# Patient Record
Sex: Male | Born: 1980 | Race: Black or African American | Hispanic: No | Marital: Single | Smoking: Former smoker
Health system: Southern US, Community
[De-identification: ages and names within clinical notes are randomized; demographics above are authoritative.]

## PROBLEM LIST (undated history)

## (undated) DIAGNOSIS — E119 Type 2 diabetes mellitus without complications: Secondary | ICD-10-CM

## (undated) DIAGNOSIS — E785 Hyperlipidemia, unspecified: Secondary | ICD-10-CM

## (undated) DIAGNOSIS — I10 Essential (primary) hypertension: Secondary | ICD-10-CM

## (undated) HISTORY — DX: Type 2 diabetes mellitus without complications: E11.9

## (undated) HISTORY — DX: Essential (primary) hypertension: I10

## (undated) HISTORY — DX: Hyperlipidemia, unspecified: E78.5

---

## 2012-12-15 ENCOUNTER — Emergency Department (HOSPITAL_COMMUNITY): Payer: Medicaid Other

## 2012-12-15 ENCOUNTER — Inpatient Hospital Stay (HOSPITAL_COMMUNITY)
Admission: EM | Admit: 2012-12-15 | Discharge: 2012-12-20 | DRG: 637 | Disposition: A | Discharge status: Home / Self Care | Payer: Medicaid Other | Attending: Internal Medicine | Admitting: Internal Medicine

## 2012-12-15 ENCOUNTER — Encounter (HOSPITAL_COMMUNITY): Payer: Self-pay | Admitting: Emergency Medicine

## 2012-12-15 DIAGNOSIS — E876 Hypokalemia: Secondary | ICD-10-CM | POA: Diagnosis present

## 2012-12-15 DIAGNOSIS — E1110 Type 2 diabetes mellitus with ketoacidosis without coma: Secondary | ICD-10-CM | POA: Diagnosis present

## 2012-12-15 DIAGNOSIS — E119 Type 2 diabetes mellitus without complications: Secondary | ICD-10-CM

## 2012-12-15 DIAGNOSIS — Z794 Long term (current) use of insulin: Secondary | ICD-10-CM

## 2012-12-15 DIAGNOSIS — G9341 Metabolic encephalopathy: Secondary | ICD-10-CM | POA: Diagnosis present

## 2012-12-15 DIAGNOSIS — F17200 Nicotine dependence, unspecified, uncomplicated: Secondary | ICD-10-CM | POA: Diagnosis present

## 2012-12-15 DIAGNOSIS — R112 Nausea with vomiting, unspecified: Secondary | ICD-10-CM | POA: Diagnosis present

## 2012-12-15 DIAGNOSIS — R631 Polydipsia: Secondary | ICD-10-CM | POA: Diagnosis present

## 2012-12-15 DIAGNOSIS — G9340 Encephalopathy, unspecified: Secondary | ICD-10-CM

## 2012-12-15 DIAGNOSIS — E870 Hyperosmolality and hypernatremia: Secondary | ICD-10-CM | POA: Diagnosis present

## 2012-12-15 DIAGNOSIS — I10 Essential (primary) hypertension: Secondary | ICD-10-CM | POA: Diagnosis present

## 2012-12-15 DIAGNOSIS — R5383 Other fatigue: Secondary | ICD-10-CM | POA: Diagnosis present

## 2012-12-15 DIAGNOSIS — R000 Tachycardia, unspecified: Secondary | ICD-10-CM | POA: Diagnosis present

## 2012-12-15 DIAGNOSIS — Z6826 Body mass index (BMI) 26.0-26.9, adult: Secondary | ICD-10-CM

## 2012-12-15 DIAGNOSIS — R5381 Other malaise: Secondary | ICD-10-CM | POA: Diagnosis present

## 2012-12-15 DIAGNOSIS — N179 Acute kidney failure, unspecified: Secondary | ICD-10-CM

## 2012-12-15 DIAGNOSIS — E1010 Type 1 diabetes mellitus with ketoacidosis without coma: Principal | ICD-10-CM | POA: Diagnosis present

## 2012-12-15 DIAGNOSIS — D72829 Elevated white blood cell count, unspecified: Secondary | ICD-10-CM | POA: Diagnosis present

## 2012-12-15 DIAGNOSIS — E43 Unspecified severe protein-calorie malnutrition: Secondary | ICD-10-CM | POA: Diagnosis present

## 2012-12-15 LAB — MAGNESIUM: Magnesium: 4.5 mg/dL — ABNORMAL HIGH (ref 1.5–2.5)

## 2012-12-15 LAB — COMPREHENSIVE METABOLIC PANEL
ALT: 36 U/L (ref 0–53)
AST: 26 U/L (ref 0–37)
Albumin: 4.1 g/dL (ref 3.5–5.2)
Alkaline Phosphatase: 91 U/L (ref 39–117)
BUN: 53 mg/dL — ABNORMAL HIGH (ref 6–23)
CO2: 8 mEq/L — CL (ref 19–32)
Calcium: 10.6 mg/dL — ABNORMAL HIGH (ref 8.4–10.5)
Chloride: 88 mEq/L — ABNORMAL LOW (ref 96–112)
Creatinine, Ser: 2.84 mg/dL — ABNORMAL HIGH (ref 0.50–1.35)
GFR calc Af Amer: 32 mL/min — ABNORMAL LOW (ref >90)
GFR calc non Af Amer: 28 mL/min — ABNORMAL LOW (ref >90)
Glucose, Bld: 1656 mg/dL (ref 70–99)
Potassium: 3.4 mEq/L — ABNORMAL LOW (ref 3.5–5.1)
Sodium: 136 mEq/L (ref 135–145)
Total Bilirubin: 0.2 mg/dL — ABNORMAL LOW (ref 0.3–1.2)
Total Protein: 8.5 g/dL — ABNORMAL HIGH (ref 6.0–8.3)

## 2012-12-15 LAB — CBC WITH DIFFERENTIAL/PLATELET
Basophils Absolute: 0 10*3/uL (ref 0.0–0.1)
Basophils Relative: 0 % (ref 0–1)
Eosinophils Absolute: 0 10*3/uL (ref 0.0–0.7)
Eosinophils Relative: 0 % (ref 0–5)
HCT: 51.2 % (ref 39.0–52.0)
Hemoglobin: 16.2 g/dL (ref 13.0–17.0)
Lymphocytes Relative: 6 % — ABNORMAL LOW (ref 12–46)
Lymphs Abs: 1.3 10*3/uL (ref 0.7–4.0)
MCH: 31 pg (ref 26.0–34.0)
MCHC: 31.6 g/dL (ref 30.0–36.0)
MCV: 98.1 fL (ref 78.0–100.0)
Monocytes Absolute: 1.8 10*3/uL — ABNORMAL HIGH (ref 0.1–1.0)
Monocytes Relative: 8 % (ref 3–12)
Neutro Abs: 19.3 10*3/uL — ABNORMAL HIGH (ref 1.7–7.7)
Neutrophils Relative %: 86 % — ABNORMAL HIGH (ref 43–77)
Platelets: 347 10*3/uL (ref 150–400)
RBC: 5.22 MIL/uL (ref 4.22–5.81)
RDW: 13.5 % (ref 11.5–15.5)
WBC: 22.4 10*3/uL — ABNORMAL HIGH (ref 4.0–10.5)

## 2012-12-15 LAB — URINALYSIS, ROUTINE W REFLEX MICROSCOPIC
Bilirubin Urine: NEGATIVE
Glucose, UA: >1000 mg/dL — AB
Ketones, ur: 15 mg/dL — AB
Leukocytes, UA: NEGATIVE
Nitrite: NEGATIVE
Protein, ur: 30 mg/dL — AB
Specific Gravity, Urine: 1.038 — ABNORMAL HIGH (ref 1.005–1.030)
Urobilinogen, UA: 0.2 mg/dL (ref 0.0–1.0)
pH: 5 (ref 5.0–8.0)

## 2012-12-15 LAB — BASIC METABOLIC PANEL
BUN: 43 mg/dL — ABNORMAL HIGH (ref 6–23)
Calcium: 8.8 mg/dL (ref 8.4–10.5)
Calcium: 8.9 mg/dL (ref 8.4–10.5)
Calcium: 9 mg/dL (ref 8.4–10.5)
Creatinine, Ser: 2.55 mg/dL — ABNORMAL HIGH (ref 0.50–1.35)
GFR calc Af Amer: 37 mL/min — ABNORMAL LOW (ref >90)
GFR calc Af Amer: 41 mL/min — ABNORMAL LOW (ref >90)
GFR calc non Af Amer: 35 mL/min — ABNORMAL LOW (ref >90)
GFR calc non Af Amer: 36 mL/min — ABNORMAL LOW (ref >90)
Glucose, Bld: 744 mg/dL (ref 70–99)
Potassium: 2.4 mEq/L — CL (ref 3.5–5.1)
Sodium: 158 mEq/L — ABNORMAL HIGH (ref 135–145)
Sodium: 159 mEq/L — ABNORMAL HIGH (ref 135–145)

## 2012-12-15 LAB — GLUCOSE, CAPILLARY
Glucose-Capillary: >600 mg/dL (ref 70–99)
Glucose-Capillary: >600 mg/dL (ref 70–99)
Glucose-Capillary: >600 mg/dL (ref 70–99)
Glucose-Capillary: 564 mg/dL (ref 70–99)
Glucose-Capillary: 587 mg/dL (ref 70–99)
Glucose-Capillary: 537 mg/dL — ABNORMAL HIGH (ref 70–99)

## 2012-12-15 LAB — BLOOD GAS, VENOUS
Acid-base deficit: 17.4 mmol/L — ABNORMAL HIGH (ref 0.0–2.0)
Bicarbonate: 11.8 mEq/L — ABNORMAL LOW (ref 20.0–24.0)
O2 Saturation: 84.7 %
Patient temperature: 98.6
TCO2: 11 mmol/L (ref 0–100)
pCO2, Ven: 38 mmHg — ABNORMAL LOW (ref 45.0–50.0)
pH, Ven: 7.121 — CL (ref 7.250–7.300)
pO2, Ven: 63.6 mmHg — ABNORMAL HIGH (ref 30.0–45.0)

## 2012-12-15 LAB — URINE MICROSCOPIC-ADD ON

## 2012-12-15 LAB — CG4 I-STAT (LACTIC ACID): Lactic Acid, Venous: 3.85 mmol/L — ABNORMAL HIGH (ref 0.5–2.2)

## 2012-12-15 MED ORDER — DEXTROSE 50 % IV SOLN: 25.0000 mL | INTRAVENOUS | Status: DC | PRN

## 2012-12-15 MED ORDER — SODIUM CHLORIDE 0.9 % IV SOLN
1000.0000 mL | Freq: Once | INTRAVENOUS | Status: AC
  Administered 2012-12-15: 1000 mL via INTRAVENOUS

## 2012-12-15 MED ORDER — DEXTROSE-NACL 5-0.45 % IV SOLN: INTRAVENOUS | Status: DC

## 2012-12-15 MED ORDER — SODIUM CHLORIDE 0.9 % IV SOLN
INTRAVENOUS | Status: DC
  Administered 2012-12-15: 200 mL/h via INTRAVENOUS

## 2012-12-15 MED ORDER — SODIUM CHLORIDE 0.9 % IV SOLN
INTRAVENOUS | Status: DC
  Administered 2012-12-15: 17:00:00 via INTRAVENOUS
  Filled 2012-12-15 (×3): qty 1

## 2012-12-15 MED ORDER — POTASSIUM CHLORIDE 10 MEQ/50ML IV SOLN
10.0000 meq | INTRAVENOUS | Status: AC
  Administered 2012-12-15 (×6): 10 meq via INTRAVENOUS
  Filled 2012-12-15 (×2): qty 50
  Filled 2012-12-15: qty 200

## 2012-12-15 MED ORDER — SODIUM CHLORIDE 0.9 % IV SOLN
INTRAVENOUS | Status: DC
  Administered 2012-12-15: 5.4 [IU]/h via INTRAVENOUS
  Filled 2012-12-15: qty 1

## 2012-12-15 MED ORDER — ONDANSETRON HCL 4 MG/2ML IJ SOLN: 4.0000 mg | Freq: Once | INTRAMUSCULAR | Status: DC

## 2012-12-15 MED ORDER — POTASSIUM CHLORIDE 10 MEQ/100ML IV SOLN
10.0000 meq | INTRAVENOUS | Status: AC
  Administered 2012-12-15 (×4): 10 meq via INTRAVENOUS
  Filled 2012-12-15: qty 100

## 2012-12-15 MED ORDER — HEPARIN SODIUM (PORCINE) 5000 UNIT/ML IJ SOLN
5000.0000 [IU] | Freq: Three times a day (TID) | INTRAMUSCULAR | Status: DC
  Administered 2012-12-15 – 2012-12-20 (×13): 5000 [IU] via SUBCUTANEOUS
  Filled 2012-12-15 (×18): qty 1

## 2012-12-15 MED ORDER — SODIUM CHLORIDE 0.9 % IV BOLUS (SEPSIS): 2000.0000 mL | Freq: Once | INTRAVENOUS | Status: DC

## 2012-12-15 MED ORDER — POTASSIUM CHLORIDE 10 MEQ/100ML IV SOLN
10.0000 meq | INTRAVENOUS | Status: DC
  Filled 2012-12-15 (×3): qty 100

## 2012-12-15 MED ORDER — LIDOCAINE HCL 1 % IJ SOLN
INTRAMUSCULAR | Status: AC
  Administered 2012-12-15: 20 mL
  Filled 2012-12-15: qty 20

## 2012-12-15 MED ORDER — SODIUM CHLORIDE 0.9 % IV SOLN
1000.0000 mL | INTRAVENOUS | Status: DC
  Administered 2012-12-15 (×2): 1000 mL via INTRAVENOUS

## 2012-12-15 NOTE — Progress Notes (Signed)
CRITICAL VALUE ALERT  Critical value received:  Glucose 1032  Date of notification:12/15/2012  Time of notification: 1600  Critical value read back: yes  Nurse who received alert:  Volanda Napoleon  MD notified (1st page):   E link  Time of first page:1605 MD notified (2nd page):  Time of second page:  Responding MD:  Dr Sung Amabile  Time MD responded:

## 2012-12-15 NOTE — ED Notes (Signed)
Insulin drip changed to 10.8. Units/hr

## 2012-12-15 NOTE — ED Provider Notes (Signed)
9:53 AM Called to bedside by nursing because pt poorly responsive and tachycardic/hypotensive. Monitor shows sinus tach in 160-170s. Pt lethargic/encephalopathic, but opens eyes to voice and follows basic commands such as squeeze fingers, wiggle toes, etc. Tachypnea. Extremities cool to touch. Dry mucus membranes. Clinically severe DKA/HHS. No identified precipitant. Two week history of polyuria/polydipsia. Increasing fatigue, nausea and vomiting for the past 2-3 days. No energy. Significant unintentional weight loss. Girlfriend reports that patient was approximately 250 pounds about 2 months ago. No significant PMHx. No meds. No routine medical care.   IVx2 established. Needs central line as well. Bedside glucose >600. EKG confirms sinus tach. 2L bolus initially and will undoubtedly need further fluid resuscitation. Insulin gtt. Electrolyte repletion pending labs. Blood gas. UA. Admit.   EKG:  Rhythm: sinus tachycardia Vent. rate 170 BPM PR interval ~120 ms QRS duration 82 ms QT/QTc 304/511 ms ST segments: NS ST changes   CENTRAL LINE Performed by: Raeford Razor Consent: The procedure was performed in an emergent situation. Required items: required blood products, implants, devices, and special equipment available Patient identity confirmed: arm band and provided demographic data Time out: Immediately prior to procedure a "time out" was called to verify the correct patient, procedure, equipment, support staff and site/side marked as required. Indications: vascular access Anesthesia: local infiltration Local anesthetic: lidocaine 1% w/o epinephrine Anesthetic total: 3 ml Patient sedated: no Preparation: skin prepped with 2% chlorhexidine Skin prep agent dried: skin prep agent completely dried prior to procedure Sterile barriers: all five maximum sterile barriers used - cap, mask, sterile gown, sterile gloves, and large sterile sheet Hand hygiene: hand hygiene performed prior to central  venous catheter insertion  Location details: L subclavian attempted w/o success. [place] then used to place L IJ under US guidance  Catheter type: triple lumen Catheter size: 8 Fr Pre-procedure: landmarks identified Ultrasound guidance: yes Successful placement: yes Post-procedure: line sutured and dressing applied Assessment: blood return through white/blue port. Unable to get blood return from brown. Free fluid flow through all three. Catheter advanced to 15cm. Placement verified by x-ray and no pneumothorax on x-rayPatient tolerance: Patient tolerated the procedure well with no immediate complications.   Raeford Razor, MD 12/15/12 1556

## 2012-12-15 NOTE — ED Notes (Signed)
Drip changed to 16.2 units/hr

## 2012-12-15 NOTE — ED Notes (Signed)
Per family member, has been weak, vomiting since Sunday

## 2012-12-15 NOTE — Progress Notes (Signed)
UR completed 

## 2012-12-15 NOTE — Progress Notes (Signed)
CRITICAL VALUE ALERT  Critical value received:  Glucose  744  Date of notification: 12/15/2012  Time of notification:  1715  Critical value read back: yes  Nurse who received alert: Volanda Napoleon  MD notified (1st page): Dr Sung Amabile  E link  Time of first page: 1800  MD notified (2nd page):  Time of second page:  Responding MD:    Time MD responded:  1800

## 2012-12-15 NOTE — Progress Notes (Signed)
CRITICAL VALUE ALERT  Critical value received: K   2.1   Date of notification: 12/15/2012  Time of notification  1555  Critical value read back: yes  Nurse who received alert: Volanda Napoleon  MD notified (1st page):1600  Time of first page: called e link  MD notified (2nd page):  Time of second page:  Responding MD:    Time MD responded:

## 2012-12-15 NOTE — Progress Notes (Signed)
[facility] did not get to see patient but will be sending him information about the [facility] Orange Card program, using the address provided.  °

## 2012-12-15 NOTE — ED Provider Notes (Signed)
CSN: [id number]     Arrival date & time 12/15/12  4098 History     First MD Initiated Contact with Patient 12/15/12 [phone]     Chief Complaint  Patient presents with  . Emesis  . Weakness   (Consider location/radiation/quality/duration/timing/severity/associated sxs/prior Treatment) HPI Patient presents emergency department with a two-week history of increased urination nausea and vomiting and increased thirst.  Patient has been drinking lots of water.  Patient, states he feels, like he is significantly dehydrated.  She does not very alert and the history is mostly given by his girlfriend the girlfriend states patient does not have any medical problems.  The patient is not able to answer review of systems information at this time. History reviewed. No pertinent past medical history. History reviewed. No pertinent past surgical history. No family history on file. History  Substance Use Topics  . Smoking status: Current Every Day Smoker  . Smokeless tobacco: Not on file  . Alcohol Use: No    Review of Systems Level V caveat applies due to  significant illness Allergies  Review of patient's allergies indicates not on file.  Home Medications  No current outpatient prescriptions on file. BP 79/53  Pulse 171  Temp(Src) 97.7 F (36.5 C) (Oral)  Resp 16  SpO2 96% Physical Exam  Nursing note and vitals reviewed. Constitutional: He is oriented to person, place, and time. He appears well-developed and well-nourished. He appears distressed.  HENT:  Head: Normocephalic and atraumatic.  Nose: Nose normal.  Mouth/Throat: Uvula is midline. Mucous membranes are dry.  Eyes: Pupils are equal, round, and reactive to light.  Neck: Normal range of motion. Neck supple.  Cardiovascular: Regular rhythm and normal heart sounds.  Tachycardia present.  Exam reveals no gallop and no friction rub.   No murmur heard. Pulmonary/Chest: Breath sounds normal. Tachypnea noted. No respiratory distress.   Neurological: He is alert and oriented to person, place, and time. He exhibits normal muscle tone. Coordination normal.  Skin: Skin is warm and dry. No rash noted. No erythema.    ED Course   Procedures (including critical care time)  Labs Reviewed  COMPREHENSIVE METABOLIC PANEL - Abnormal; Notable for the following:    Potassium 3.4 (*)    Chloride 88 (*)    CO2 8 (*)    Glucose, Bld 1656 (*)    BUN 53 (*)    Creatinine, Ser 2.84 (*)    Calcium 10.6 (*)    Total Protein 8.5 (*)    Total Bilirubin 0.2 (*)    GFR calc non Af Amer 28 (*)    GFR calc Af Amer 32 (*)    All other components within normal limits  GLUCOSE, CAPILLARY - Abnormal; Notable for the following:    Glucose-Capillary >600 (*)    All other components within normal limits  MAGNESIUM - Abnormal; Notable for the following:    Magnesium 4.5 (*)    All other components within normal limits  CG4 I-STAT (LACTIC ACID) - Abnormal; Notable for the following:    Lactic Acid, Venous 3.85 (*)    All other components within normal limits  CBC WITH DIFFERENTIAL  URINALYSIS, ROUTINE W REFLEX MICROSCOPIC  BLOOD GAS, VENOUS  CBC WITH DIFFERENTIAL    1. DKA (diabetic ketoacidoses)   2. Renal failure, acute   3. Metabolic encephalopathy    Patient is very ill at this time.  He will be admitted by the critical care doctors.  The patient is in severe DKA.  We inserted a central line into the patient, that he's gotten multiple liters of fluid.  The glucose stabilizer was started MDM  CRITICAL CARE Performed by: Carlyle Dolly Total critical care time: 65 minutes Critical care time was exclusive of separately billable procedures and treating other patients. Critical care was necessary to treat or prevent imminent or life-threatening deterioration. Critical care was time spent personally by me on the following activities: development of treatment plan with patient and/or surrogate as well as nursing, discussions with  consultants, evaluation of patient's response to treatment, examination of patient, obtaining history from patient or surrogate, ordering and performing treatments and interventions, ordering and review of laboratory studies, ordering and review of radiographic studies, pulse oximetry and re-evaluation of patient's condition. Care   Date: 12/15/2012  Rate: 171  Rhythm: sinus tachycardia  QRS Axis: normal  Intervals: normal  ST/T Wave abnormalities: normal  Conduction Disutrbances:none  Narrative Interpretation:   Old EKG Reviewed: changes noted    Carlyle Dolly, PA-C 12/15/12 1529

## 2012-12-15 NOTE — Progress Notes (Signed)
During [facility] ED 12/15/12 visit CM spoke with pt and his girlfriend who confirms self pay [facility] resident with no pcp. Pt not oriented at this time.  Girlfriend at bedside coaching him to respond but response minimal (even when other visitors arrived) CM discussed and provided written information for self pay pcps, importance of pcp for f/u care, www.needymeds.org, discounted pharmacies, affordable care act, diabetic supplies and other [place] resources such as financial assistance, DSS and  health department  Reviewed resources for [employer] self pay pcps like [facility], family medicine at [address], [facility] family practice, general medical clinics, [facility] urgent care plus others, [facility] out patient pharmacies and housing Pt's girlfriend voiced understanding and appreciation of resources provided. Girlfriend familiar with some of resources states she assisted her grandmother Encouraged her to request CM if further questions and to contact [facility]  [facility] to send pt a letter

## 2012-12-15 NOTE — Progress Notes (Signed)
CRITICAL VALUE ALERT  Critical value received:  K   2.1  Date of notification: 12/15/2012  Time of notification: 1715  Critical value read back     yes  Nurse who received alert:  Volanda Napoleon  MD notified (1st page): Dr Sung Amabile   Time of first page:  1800 e link  MD notified (2nd page):  Time of second page:  Responding MD:   Time MD responded:  1800

## 2012-12-15 NOTE — H&P (Signed)
PULMONARY  / CRITICAL CARE MEDICINE  Name: Justin Wilkinson MRN: [id number] DOB: 09/19/1980    ADMISSION DATE:  12/15/2012  REFERRING MD :  Juleen China, ED PRIMARY SERVICE: PCCM  CHIEF COMPLAINT:  Thirsty, confusion  BRIEF PATIENT DESCRIPTION: 32 y.o admitted with new onset severe DKA & confusion  SIGNIFICANT EVENTS / STUDIES:    LINES / TUBES: LIJ 8/14 >>  CULTURES: Blood 8/14 >>  ANTIBIOTICS:   HISTORY OF PRESENT ILLNESS:  32 year old male who was in his usual state of health until 3 days ago polyuria and polydipsia. He was went to ED with increasing confusion by his girlfriend who provides the history. She was pumping fluids into him, but it seems like he was drinking mostly juices. She does report that he drank 60 bottles water in the last 2 days. His labs in the ED showed sugars of 1600 with metabolic acidosis and high anion gap. He was hydrated with 3 L of normal saline and insulin drip was started.   PAST MEDICAL HISTORY :  History reviewed. No pertinent past medical history. History reviewed. No pertinent past surgical history. Prior to Admission medications   Medication Sig Start Date End Date Taking? Authorizing Provider  Multiple Vitamin (MULTIVITAMIN WITH MINERALS) TABS tablet Take 1 tablet by mouth daily.   Yes Historical Provider, MD   Allergies  Allergen Reactions  . Fish Allergy Anaphylaxis  . Shellfish Allergy Anaphylaxis    FAMILY HISTORY:  No family history on file. SOCIAL HISTORY:  reports that he has been smoking.  He does not have any smokeless tobacco history on file. He reports that he does not drink alcohol or use illicit drugs.  REVIEW OF SYSTEMS:  Unobtainable since onfused  SUBJECTIVE:   VITAL SIGNS: Temp:  [97.7 F (36.5 C)] 97.7 F (36.5 C) (08/14 0929) Pulse Rate:  [171] 171 (08/14 0929) Resp:  [16] 16 (08/14 0929) BP: (79)/(53) 79/53 mmHg (08/14 0929) SpO2:  [96 %] 96 % (08/14 0929) HEMODYNAMICS:   VENTILATOR SETTINGS:   INTAKE /  OUTPUT: Intake/Output   None     PHYSICAL EXAMINATION: Gen.  well-nourished, in no distress, normal affect ENT - no lesions, no post nasal drip, dry  mucosa Neck: No JVD, no thyromegaly, no carotid bruits Lungs: no use of accessory muscles, no dullness to percussion, clear without rales or rhonchi  Cardiovascular: Rhythm tachy, heart sounds  normal, no murmurs, no peripheral edema Abdomen: soft and non-tender, no hepatosplenomegaly, BS normal. Musculoskeletal: No deformities, no cyanosis or clubbing Neuro:  Awake, confused, oriented to name,place but  not to time, non focal Skin:  Warm, no lesions/ rash    LABS:  CBC Recent Labs     12/15/12  1130  WBC  22.4*  HGB  16.2  HCT  51.2  PLT  347   Coag's No results found for this basename: APTT, INR,  in the last 72 hours BMET Recent Labs     12/15/12  0957  NA  136  K  3.4*  CL  88*  CO2  8*  BUN  53*  CREATININE  2.84*  GLUCOSE  1656*   Electrolytes Recent Labs     12/15/12  0957  CALCIUM  10.6*  MG  4.5*   Sepsis Markers No results found for this basename: LACTICACIDVEN, PROCALCITON, O2SATVEN,  in the last 72 hours ABG No results found for this basename: PHART, PCO2ART, PO2ART,  in the last 72 hours Liver Enzymes Recent Labs     12/15/12  0957  AST  26  ALT  36  ALKPHOS  91  BILITOT  0.2*  ALBUMIN  4.1   Cardiac Enzymes No results found for this basename: TROPONINI, PROBNP,  in the last 72 hours Glucose Recent Labs     12/15/12  0945  12/15/12  1204  GLUCAP  >600*  >600*    Imaging Dg Chest Portable 1 View  12/15/2012   *RADIOLOGY REPORT*  Clinical Data: Weakness  PORTABLE CHEST - 1 VIEW  Comparison: None.  Findings: Cardiomediastinal silhouette appears normal.  No acute pulmonary disease is noted.  Bony thorax is intact.  Left internal jugular venous catheter is noted with tip in expected position of the left brachiocephalic vein. No pneumothorax or pleural effusion is noted.  IMPRESSION: No  acute cardiopulmonary abnormality seen.   Original Report Authenticated By: Lupita Raider.,  M.D.     CXR: no infx  ASSESSMENT / PLAN:  PULMONARY A:No issues P:   O2 prn  CARDIOVASCULAR A: sinus tachycardia P:  Hydrate  RENAL A:  AKI -due to DKA P:   Hydrate  GASTROINTESTINAL A:  No issues P:   Npo  - ok to advance to Po fluids when able  HEMATOLOGIC A:  Hemoconcentration P:  Sq heparin for dv t proph  INFECTIOUS A:  Leucocytosis, no obvious source P:   Blood cx, hold off abx  ENDOCRINE A:  DKA, new onset   P:   6L NS bolus, then 200/h Serial BMETs, insulin gtt, when CBG < 250 , add dextrose to IVS Expect Na to rise as CBG corrects  NEUROLOGIC A:  Confusion -improving already P:   Low threshold to obtain CT angio or MRV for sagital sinus thrombosis if mental status worsens or plateaus - will hold off given improvement for now  TODAY'S SUMMARY: DKA protocol  I have personally obtained a history, examined the patient, evaluated laboratory and imaging results, formulated the assessment and plan and placed orders. CRITICAL CARE: The patient is critically ill with multiple organ systems failure and requires high complexity decision making for assessment and support, frequent evaluation and titration of therapies, application of advanced monitoring technologies and extensive interpretation of multiple databases. Critical Care Time devoted to patient care services described in this note is 50 minutes.   Oretha Milch  Pulmonary and Critical Care Medicine [facility] Pager: [phone]  12/15/2012, 12:35 PM

## 2012-12-16 LAB — BASIC METABOLIC PANEL
BUN: 37 mg/dL — ABNORMAL HIGH (ref 6–23)
BUN: 35 mg/dL — ABNORMAL HIGH (ref 6–23)
BUN: 34 mg/dL — ABNORMAL HIGH (ref 6–23)
CO2: 27 mEq/L (ref 19–32)
CO2: 23 mEq/L (ref 19–32)
Calcium: 8.8 mg/dL (ref 8.4–10.5)
Calcium: 8.4 mg/dL (ref 8.4–10.5)
Chloride: >130 mEq/L (ref 96–112)
Creatinine, Ser: 1.85 mg/dL — ABNORMAL HIGH (ref 0.50–1.35)
GFR calc Af Amer: 54 mL/min — ABNORMAL LOW (ref >90)
GFR calc non Af Amer: 42 mL/min — ABNORMAL LOW (ref >90)
GFR calc non Af Amer: 47 mL/min — ABNORMAL LOW (ref >90)
GFR calc non Af Amer: 52 mL/min — ABNORMAL LOW (ref >90)
Glucose, Bld: 330 mg/dL — ABNORMAL HIGH (ref 70–99)
Glucose, Bld: 164 mg/dL — ABNORMAL HIGH (ref 70–99)
Glucose, Bld: 490 mg/dL — ABNORMAL HIGH (ref 70–99)

## 2012-12-16 LAB — GLUCOSE, CAPILLARY
Glucose-Capillary: 162 mg/dL — ABNORMAL HIGH (ref 70–99)
Glucose-Capillary: 204 mg/dL — ABNORMAL HIGH (ref 70–99)
Glucose-Capillary: 238 mg/dL — ABNORMAL HIGH (ref 70–99)
Glucose-Capillary: 400 mg/dL — ABNORMAL HIGH (ref 70–99)

## 2012-12-16 LAB — CBC
HCT: 47.8 % (ref 39.0–52.0)
MCH: 30.5 pg (ref 26.0–34.0)
MCV: 86.1 fL (ref 78.0–100.0)
Platelets: 289 10*3/uL (ref 150–400)
RBC: 5.55 MIL/uL (ref 4.22–5.81)

## 2012-12-16 MED ORDER — INSULIN GLARGINE 100 UNIT/ML ~~LOC~~ SOLN
10.0000 [IU] | Freq: Every day | SUBCUTANEOUS | Status: DC
  Administered 2012-12-16: 10 [IU] via SUBCUTANEOUS
  Filled 2012-12-16: qty 0.1

## 2012-12-16 MED ORDER — SODIUM CHLORIDE 0.45 % IV SOLN
INTRAVENOUS | Status: DC
  Administered 2012-12-16: 100 mL/h via INTRAVENOUS
  Administered 2012-12-17: 1000 mL via INTRAVENOUS
  Administered 2012-12-17 – 2012-12-20 (×6): via INTRAVENOUS
  Administered 2012-12-20: 150 mL/h via INTRAVENOUS

## 2012-12-16 MED ORDER — POTASSIUM CHLORIDE 10 MEQ/50ML IV SOLN
10.0000 meq | INTRAVENOUS | Status: AC
  Administered 2012-12-16 (×6): 10 meq via INTRAVENOUS
  Filled 2012-12-16: qty 100
  Filled 2012-12-16: qty 150

## 2012-12-16 MED ORDER — "BD GETTING STARTED TAKE HOME KIT: 1ML X 30 G SYRINGES, "
1.0000 | Freq: Once | Status: AC
  Administered 2012-12-16: 1
  Filled 2012-12-16: qty 1

## 2012-12-16 MED ORDER — BD GETTING STARTED TAKE HOME KIT: 1/2ML X 30G SYRINGES
1.0000 | Freq: Once | Status: DC
  Filled 2012-12-16: qty 1

## 2012-12-16 MED ORDER — INSULIN ASPART 100 UNIT/ML ~~LOC~~ SOLN
0.0000 [IU] | SUBCUTANEOUS | Status: DC
  Administered 2012-12-16: 3 [IU] via SUBCUTANEOUS
  Administered 2012-12-16 (×2): 15 [IU] via SUBCUTANEOUS
  Administered 2012-12-16: 8 [IU] via SUBCUTANEOUS
  Administered 2012-12-16 – 2012-12-17 (×3): 15 [IU] via SUBCUTANEOUS

## 2012-12-16 MED ORDER — INSULIN GLARGINE 100 UNIT/ML ~~LOC~~ SOLN
10.0000 [IU] | Freq: Two times a day (BID) | SUBCUTANEOUS | Status: DC
  Administered 2012-12-16 (×2): 10 [IU] via SUBCUTANEOUS
  Filled 2012-12-16 (×3): qty 0.1

## 2012-12-16 MED ORDER — POTASSIUM CHLORIDE 10 MEQ/50ML IV SOLN
INTRAVENOUS | Status: AC
  Filled 2012-12-16: qty 50

## 2012-12-16 MED ORDER — LIVING WELL WITH DIABETES BOOK
Freq: Once | Status: AC
  Administered 2012-12-16: 14:00:00
  Filled 2012-12-16 (×2): qty 1

## 2012-12-16 MED ORDER — K PHOS MONO-SOD PHOS DI & MONO 155-852-130 MG PO TABS
500.0000 mg | ORAL_TABLET | Freq: Three times a day (TID) | ORAL | Status: AC
  Administered 2012-12-16 (×3): 500 mg via ORAL
  Filled 2012-12-16 (×3): qty 2

## 2012-12-16 NOTE — Progress Notes (Signed)
PULMONARY  / CRITICAL CARE MEDICINE  Name: Justin Wilkinson MRN: [id number] DOB: 09-03-1980    ADMISSION DATE:  12/15/2012  REFERRING MD :  Juleen China, ED PRIMARY SERVICE: PCCM  CHIEF COMPLAINT:  Thirsty, confusion  BRIEF PATIENT DESCRIPTION: 31 y.o admitted with new onset severe DKA & confusion was in his usual state of health until 3 days ago polyuria and polydipsia. He was went to ED with increasing confusion by his girlfriend who provides the history. She was pumping fluids into him, but it seems like he was drinking mostly juices. She does report that he drank 60 bottles water in the last 2 days. His labs in the ED showed sugars of 1600 with metabolic acidosis and high anion gap. He was hydrated with 3 L of normal saline and insulin drip was started.    SIGNIFICANT EVENTS / STUDIES:    LINES / TUBES: LIJ 8/14 >>  CULTURES: Blood 8/14 >>  ANTIBIOTICS:  SUBJECTIVE: mental status much improved , sleeping now Family at bedside Afebrile Good UO  VITAL SIGNS: Temp:  [97.4 F (36.3 C)-98.6 F (37 C)] 98 F (36.7 C) (08/15 0400) Pulse Rate:  [100-171] 109 (08/15 0800) Resp:  [13-27] 27 (08/15 0800) BP: (79-169)/(53-125) 156/108 mmHg (08/15 0800) SpO2:  [96 %-100 %] 100 % (08/15 0800) Weight:  [87.2 kg (192 lb 3.9 oz)] 87.2 kg (192 lb 3.9 oz) (08/14 1400) HEMODYNAMICS:   VENTILATOR SETTINGS:   INTAKE / OUTPUT: Intake/Output     08/14 0701 - 08/15 0700 08/15 0701 - 08/16 0700   I.V. (mL/kg) 2575 (29.5)    Other 100    IV Piggyback 750    Total Intake(mL/kg) 3425 (39.3)    Urine (mL/kg/hr) 1650    Total Output 1650     Net +1775            PHYSICAL EXAMINATION: Gen.  well-nourished, in no distress, normal affect ENT - no lesions, no post nasal drip, dry  mucosa Neck: No JVD, no thyromegaly, no carotid bruits Lungs: no use of accessory muscles, no dullness to percussion, clear without rales or rhonchi  Cardiovascular: Rhythm tachy, heart sounds  normal, no murmurs, no  peripheral edema Abdomen: soft and non-tender, no hepatosplenomegaly, BS normal. Musculoskeletal: No deformities, no cyanosis or clubbing Neuro:  non focal Skin:  Warm, no lesions/ rash    LABS:  CBC Recent Labs     12/15/12  1130  12/16/12  0500  WBC  22.4*  21.1*  HGB  16.2  16.9  HCT  51.2  47.8  PLT  347  289   Coag's No results found for this basename: APTT, INR,  in the last 72 hours BMET Recent Labs     12/15/12  2000  12/16/12  0055  12/16/12  0517  NA  159*  165*  166*  K  2.4*  2.7*  3.3*  CL  122*  >130*  >130*  CO2  26  27  26   BUN  43*  37*  35*  CREATININE  2.31*  2.01*  1.85*  GLUCOSE  649*  330*  164*   Electrolytes Recent Labs     12/15/12  0957   12/15/12  2000  12/16/12  0055  12/16/12  0500  12/16/12  0517  CALCIUM  10.6*   < >  9.0  8.8   --   9.3  MG  4.5*   --    --    --    --    --  PHOS   --    --    --    --   1.4*   --    < > = values in this interval not displayed.   Sepsis Markers No results found for this basename: LACTICACIDVEN, PROCALCITON, O2SATVEN,  in the last 72 hours ABG No results found for this basename: PHART, PCO2ART, PO2ART,  in the last 72 hours Liver Enzymes Recent Labs     12/15/12  0957  AST  26  ALT  36  ALKPHOS  91  BILITOT  0.2*  ALBUMIN  4.1   Cardiac Enzymes No results found for this basename: TROPONINI, PROBNP,  in the last 72 hours Glucose Recent Labs     12/16/12  0057  12/16/12  0121  12/16/12  0232  12/16/12  0346  12/16/12  0455  12/16/12  0806  GLUCAP  302*  282*  238*  204*  162*  258*    Imaging Dg Chest Portable 1 View  12/15/2012   *RADIOLOGY REPORT*  Clinical Data: Weakness  PORTABLE CHEST - 1 VIEW  Comparison: None.  Findings: Cardiomediastinal silhouette appears normal.  No acute pulmonary disease is noted.  Bony thorax is intact.  Left internal jugular venous catheter is noted with tip in expected position of the left brachiocephalic vein. No pneumothorax or pleural  effusion is noted.  IMPRESSION: No acute cardiopulmonary abnormality seen.   Original Report Authenticated By: Lupita Raider.,  M.D.     CXR: no infx  ASSESSMENT / PLAN:  PULMONARY A:No issues P:   O2 prn  CARDIOVASCULAR A: sinus tachycardia Hypertension P:  Improved, monitor BP - do not want to commit him to anti-hypertensives yet  RENAL A:  AKI -due to DKA, resolving Hypernatremia Hypophosphatemia P:   Ct PO & IV fluids (hypotonic) Replete phos  GASTROINTESTINAL A:  No issues P:    advance to diabetic diet  HEMATOLOGIC A:  Hemoconcentration P:  Sq heparin for dv t proph  INFECTIOUS A:  Leucocytosis, no obvious source P:   Blood cx, hold off abx  ENDOCRINE A:  DKA, new onset   P:   Lantus 10 , increase to bid SSI Diabetic teaching - will need endocrine FU on dc , assume will need insulin short term & then decide whether true IDDM Would not discharge him over weekend since self pay pt & would not get supplies  NEUROLOGIC A:  Confusion -improving already P:   Low threshold to obtain CT angio or MRV for sagital sinus thrombosis if mental status worsens or plateaus - no need given improvement for now  TODAY'S SUMMARY: Transfer to floor, rpt BMET, diabetic teaching, allow PO, will ask [facility] to assume care in am  I have personally obtained a history, examined the patient, evaluated laboratory and imaging results, formulated the assessment and plan and placed orders. CRITICAL CARE: The patient is critically ill with multiple organ systems failure and requires high complexity decision making for assessment and support, frequent evaluation and titration of therapies, application of advanced monitoring technologies and extensive interpretation of multiple databases. Critical Care Time devoted to patient care services described in this note is 32 minutes.   Oretha Milch  Pulmonary and Critical Care Medicine [facility] Pager: [phone]  12/16/2012, 8:56 AM

## 2012-12-16 NOTE — Progress Notes (Signed)
Attempted to get patient to view diabetes education channels. Pt declined stating he did not want to "drain himself" today. Will continue to educate pt in importance of diabetes education.

## 2012-12-16 NOTE — Progress Notes (Signed)
Inpatient Diabetes Program Recommendations  AACE/ADA: New Consensus Statement on Inpatient Glycemic Control (2013)  Target Ranges:  Prepandial:   less than 140 mg/dL      Peak postprandial:   less than 180 mg/dL (1-2 hours)      Critically ill patients:  140 - 180 mg/dL   Reason for Visit: DKA  32 y.o admitted with new onset severe DKA & confusion  was in his usual state of health until 3 days ago polyuria and polydipsia. He was went to ED with increasing confusion by his girlfriend who provides the history. She was pumping fluids into him, but it seems like he was drinking mostly juices. She does report that he drank 60 bottles water in the last 2 days. His labs in the ED showed sugars of 1600 with metabolic acidosis and high anion gap. He was hydrated with 3 L of normal saline and insulin drip was started.  Pt states he has lost approx 50 pounds over the past month or so. Positive fam hx DM. Transitioned to SQ insulin and began educating pt on diabetes diagnosis, importance of glucose control to prevent complications, diet, exercise, hypoglycemia s/s and treatment, and insulin administration. Pt and GF very receptive to education and pt is motivated to control his diabetes. Stressed importance of f/u with PCP to manage DM. Gave glucose meter information.   Results for SELESTINO, NILA (MRN [id number]) as of 12/16/2012 18:42  Ref. Range 12/16/2012 02:32 12/16/2012 03:46 12/16/2012 04:55 12/16/2012 08:06 12/16/2012 12:39  Glucose-Capillary Latest Range: 70-99 mg/dL 409 (H) 811 (H) 914 (H) 258 (H) 476 (H)   Results for WILLEM, KLINGENSMITH (MRN [id number]) as of 12/16/2012 18:42  Ref. Range 12/16/2012 14:30  Sodium Latest Range: 135-145 mEq/L 152 (H)  Potassium Latest Range: 3.5-5.1 mEq/L 5.1  Chloride Latest Range: 96-112 mEq/L 118 (H)  CO2 Latest Range: 19-32 mEq/L 23  BUN Latest Range: 6-23 mg/dL 34 (H)  Creatinine Latest Range: 0.50-1.35 mg/dL 0.86 (H)  Calcium Latest Range: 8.4-10.5 mg/dL 8.4  GFR  calc non Af Amer Latest Range: >90 mL/min 52 (L)  GFR calc Af Amer Latest Range: >90 mL/min 61 (L)  Glucose Latest Range: 70-99 mg/dL 578 (H)    Inpatient Diabetes Program Recommendations Insulin - Basal: Uptitrate until FBS<150 mg/dL Correction (SSI): Change to Novolog moderate tidwc and hs since pt is eating Insulin - Meal Coverage: Needs meal coverage insulin - Add Novolog 4 units tidwc HgbA1C: Please order HgbA1C to assess glycemic control prior to hospitalization Outpatient Referral: OP Diabetes Education consult done  Pt will need prescription for meter and supplies at discharge. RN to teach insulin administration and encourage pt to view diabetes videos on pt ed channel. Gave Living Well book and diet information, hypoglycemia s/s and treatment.  Case management - Recommend pt to be [facility] to [facility] - Dr. Laural Benes, to follow for diabetes Discussed with Frederico Hamman, RNs.

## 2012-12-16 NOTE — ED Provider Notes (Signed)
Medical screening examination/treatment/procedure(s) were conducted as a shared visit with non-physician practitioner(s) and myself.  I personally evaluated the patient during the encounter.  Please see other note.   Raeford Razor, MD 12/16/12 [phone]

## 2012-12-16 NOTE — Progress Notes (Signed)
CBG 476. Dr. Vassie Loll notified. Order to give 15 units novolog insulin now and obtain BMET.

## 2012-12-16 NOTE — Progress Notes (Signed)
eLink Physician-Brief Progress Note Patient Name: BANNON GIAMMARCO DOB: [birth year] MRN: [id number]  Date of Service  12/16/2012   HPI/Events of Note   DKA management Hypernatremia Hypokalemia   eICU Interventions   Lantus 10 now, then qhs Stop Insulin gtt in 2 hours SSI q4h Change IVF to 1/2 NS@100  K 10 x 6 BMP q4h    Intervention Category Major Interventions: Acid-Base disturbance - evaluation and management;Electrolyte abnormality - evaluation and management  Riyan Haile 12/16/2012, 2:12 AM

## 2012-12-17 DIAGNOSIS — I10 Essential (primary) hypertension: Secondary | ICD-10-CM | POA: Diagnosis present

## 2012-12-17 LAB — BASIC METABOLIC PANEL
BUN: 28 mg/dL — ABNORMAL HIGH (ref 6–23)
CO2: 24 mEq/L (ref 19–32)
Calcium: 8.4 mg/dL (ref 8.4–10.5)
GFR calc non Af Amer: 76 mL/min — ABNORMAL LOW (ref >90)
Glucose, Bld: 340 mg/dL — ABNORMAL HIGH (ref 70–99)
Sodium: 145 mEq/L (ref 135–145)

## 2012-12-17 LAB — CBC
Hemoglobin: 13.3 g/dL (ref 13.0–17.0)
MCH: 30.5 pg (ref 26.0–34.0)
MCHC: 35.3 g/dL (ref 30.0–36.0)
MCV: 86.5 fL (ref 78.0–100.0)
Platelets: 200 10*3/uL (ref 150–400)
RBC: 4.36 MIL/uL (ref 4.22–5.81)

## 2012-12-17 LAB — GLUCOSE, CAPILLARY
Glucose-Capillary: 398 mg/dL — ABNORMAL HIGH (ref 70–99)
Glucose-Capillary: 362 mg/dL — ABNORMAL HIGH (ref 70–99)
Glucose-Capillary: 336 mg/dL — ABNORMAL HIGH (ref 70–99)

## 2012-12-17 LAB — MAGNESIUM: Magnesium: 2.5 mg/dL (ref 1.5–2.5)

## 2012-12-17 LAB — HEMOGLOBIN A1C: Mean Plasma Glucose: 295 mg/dL — ABNORMAL HIGH (ref <117)

## 2012-12-17 MED ORDER — INSULIN GLARGINE 100 UNIT/ML ~~LOC~~ SOLN
15.0000 [IU] | Freq: Two times a day (BID) | SUBCUTANEOUS | Status: DC
  Administered 2012-12-17 (×2): 15 [IU] via SUBCUTANEOUS
  Filled 2012-12-17 (×3): qty 0.15

## 2012-12-17 MED ORDER — INSULIN ASPART 100 UNIT/ML ~~LOC~~ SOLN
0.0000 [IU] | Freq: Three times a day (TID) | SUBCUTANEOUS | Status: DC
  Administered 2012-12-17 (×2): 11 [IU] via SUBCUTANEOUS
  Administered 2012-12-17: 8 [IU] via SUBCUTANEOUS

## 2012-12-17 MED ORDER — SODIUM CHLORIDE 0.9 % IJ SOLN
10.0000 mL | INTRAMUSCULAR | Status: DC | PRN
  Administered 2012-12-17 – 2012-12-18 (×3): 20 mL
  Administered 2012-12-19: 10 mL

## 2012-12-17 MED ORDER — INSULIN ASPART 100 UNIT/ML ~~LOC~~ SOLN
4.0000 [IU] | Freq: Three times a day (TID) | SUBCUTANEOUS | Status: DC
  Administered 2012-12-17 (×3): 4 [IU] via SUBCUTANEOUS

## 2012-12-17 MED ORDER — SODIUM CHLORIDE 0.9 % IJ SOLN
10.0000 mL | Freq: Two times a day (BID) | INTRAMUSCULAR | Status: DC
  Administered 2012-12-17: 40 mL
  Administered 2012-12-18: 10 mL
  Administered 2012-12-19 – 2012-12-20 (×2): 20 mL

## 2012-12-17 MED ORDER — INSULIN ASPART 100 UNIT/ML ~~LOC~~ SOLN
0.0000 [IU] | Freq: Every day | SUBCUTANEOUS | Status: DC
  Administered 2012-12-17: 4 [IU] via SUBCUTANEOUS

## 2012-12-17 NOTE — Progress Notes (Signed)
[facility] PROGRESS NOTE  Justin Wilkinson QIO:[id number] DOB: [birth year] DOA: 12/15/2012 PCP: No primary provider on file.  Brief narrative: Justin Wilkinson is an 32 y.o. male with no significant PMH who was admitted to the critical care service on 12/15/2012 in DKA and with altered mental status. His presenting glucose was 1600 with metabolic acidosis, acute renal failure and high anion gap. His DKA resolved with vigorous hydration and an insulin drip.  Assessment/Plan: Principal Problem:   DKA (diabetic ketoacidoses) -Treated with aggressive IV fluids and an insulin drip. Now on basal/bolus insulin. -Seen by diabetes coordinator 12/16/2012. Diabetes education initiated.  Spoke with him at length about the disease, it's cause, and management. -Check hemoglobin A1c. -Current insulin therapy: Lantus 10 units twice a day and sliding scale, moderate sensitivity, every 4 hours. Since eating, will change to q. a.c. and at bedtime, and add meal coverage. Increase Lantus to 15 units twice a day. Active Problems:   AKI (acute kidney injury) -Resolving status post aggressive IV fluids.   Acute encephalopathy -Monitor, improved. A low threshold to obtain CT angiogram or MRI for sagittal sinus thrombosis if mental status worsen or plateau's.   Hypokalemia -Corrected with aggressive replacement.   Essential hypertension, benign -Will likely need therapy. Once renal function stabilizes, would start ACE or ARB.   Dehydration secondary to osmotic diuresis with hypernatremia and acute renal failure -Hypernatremia resolved. Acute renal failure resolving.   Hypophosphatemia -Replacing.   Leukocytosis, unspecified -May be a stress response. No obvious source of infection. -Blood cultures negative to date.  Code Status: Full. Family Communication: Wife at bedside. Disposition Plan: Home in next 48 hours.   Medical Consultants:  Dr. Cyril Mourning, Critical care  Other Consultants:  Diabetes  coordinator  Anti-infectives:  None.  HPI/Subjective: Justin Wilkinson feels well.  He tells me he has lost about 50 pounds in a few months time.  He is mentally clear, oriented.  No further polyuria or polydipsia.  Objective: Filed Vitals:   12/16/12 1600 12/16/12 1738 12/16/12 2207 12/17/12 0632  BP: 166/104 140/92 163/97 167/99  Pulse: 121 116 111 109  Temp:  97.5 F (36.4 C) 98.9 F (37.2 C) 98 F (36.7 C)  TempSrc:  Axillary Oral Oral  Resp: 19 18 18 18   Height:      Weight:      SpO2: 98% 95% 93% 94%    Intake/Output Summary (Last 24 hours) at 12/17/12 0807 Last data filed at 12/17/12 3244  Gross per 24 hour  Intake   4200 ml  Output   2025 ml  Net   2175 ml    Exam: Gen:  NAD Cardiovascular:  RRR, No M/R/G Respiratory:  Lungs CTAB Gastrointestinal:  Abdomen soft, NT/ND, + BS Extremities:  No C/E/C  Data Reviewed: Basic Metabolic Panel:  Recent Labs Lab 12/15/12 0957  12/15/12 2000 12/16/12 0055 12/16/12 0500 12/16/12 0517 12/16/12 1430 12/17/12 0435  NA 136  < > 159* 165*  --  166* 152* 145  K 3.4*  < > 2.4* 2.7*  --  3.3* 5.1 3.7  CL 88*  < > 122* >130*  --  >130* 118* 111  CO2 8*  < > 26 27  --  26 23 24   GLUCOSE 1656*  < > 649* 330*  --  164* 490* 340*  BUN 53*  < > 43* 37*  --  35* 34* 28*  CREATININE 2.84*  < > 2.31* 2.01*  --  1.85* 1.68* 1.24  CALCIUM 10.6*  < > 9.0 8.8  --  9.3 8.4 8.4  MG 4.5*  --   --   --   --   --   --  2.5  PHOS  --   --   --   --  1.4*  --   --  1.8*  < > = values in this interval not displayed. GFR Estimated Creatinine Clearance: 92.5 ml/min (by C-G formula based on Cr of 1.24). Liver Function Tests:  Recent Labs Lab 12/15/12 0957  AST 26  ALT 36  ALKPHOS 91  BILITOT 0.2*  PROT 8.5*  ALBUMIN 4.1   CBC:  Recent Labs Lab 12/15/12 1130 12/16/12 0500 12/17/12 0435  WBC 22.4* 21.1* 16.7*  NEUTROABS 19.3*  --   --   HGB 16.2 16.9 13.3  HCT 51.2 47.8 37.7*  MCV 98.1 86.1 86.5  PLT 347 289 200    CBG:  Recent Labs Lab 12/16/12 2004 12/16/12 2354 12/17/12 0157 12/17/12 0418 12/17/12 0738  GLUCAP 378* 400* 340* 362* 278*   Microbiology Recent Results (from the past 240 hour(s))  CULTURE, BLOOD (ROUTINE X 2)     Status: None   Collection Time    12/15/12 12:25 PM      Result Value Range Status   Specimen Description BLOOD LEFT ARM   Final   Special Requests BOTTLES DRAWN AEROBIC AND ANAEROBIC 4CC   Final   Culture  Setup Time     Final   Value: 12/15/2012 15:04     Performed at [facility]   Culture     Final   Value:        BLOOD CULTURE RECEIVED NO GROWTH TO DATE CULTURE WILL BE HELD FOR 5 DAYS BEFORE ISSUING A FINAL NEGATIVE REPORT     Performed at [facility]   Report Status PENDING   Incomplete  CULTURE, BLOOD (ROUTINE X 2)     Status: None   Collection Time    12/15/12 12:25 PM      Result Value Range Status   Specimen Description BLOOD LEFT IJ   Final   Special Requests BOTTLES DRAWN AEROBIC AND ANAEROBIC 5CC   Final   Culture  Setup Time     Final   Value: 12/15/2012 15:04     Performed at [facility]   Culture     Final   Value:        BLOOD CULTURE RECEIVED NO GROWTH TO DATE CULTURE WILL BE HELD FOR 5 DAYS BEFORE ISSUING A FINAL NEGATIVE REPORT     Performed at [facility]   Report Status PENDING   Incomplete  MRSA PCR SCREENING     Status: None   Collection Time    12/15/12  2:15 PM      Result Value Range Status   MRSA by PCR NEGATIVE  NEGATIVE Final   Comment:            The GeneXpert MRSA Assay (FDA     approved for NASAL specimens     only), is one component of a     comprehensive MRSA colonization     surveillance program. It is not     intended to diagnose MRSA     infection nor to guide or     monitor treatment for     MRSA infections.     Procedures and Diagnostic Studies: Dg Chest Portable 1 View  12/15/2012   *RADIOLOGY REPORT*  Clinical Data: Weakness  PORTABLE CHEST - 1 VIEW  Comparison:  None.  Findings: Cardiomediastinal silhouette appears normal.  No acute pulmonary disease is noted.  Bony thorax is intact.  Left internal jugular venous catheter is noted with tip in expected position of the left brachiocephalic vein. No pneumothorax or pleural effusion is noted.  IMPRESSION: No acute cardiopulmonary abnormality seen.   Original Report Authenticated By: Lupita Raider.,  M.D.    Scheduled Meds: . heparin  5,000 Units Subcutaneous Q8H  . insulin aspart  0-15 Units Subcutaneous Q4H  . insulin glargine  10 Units Subcutaneous BID  . ondansetron (ZOFRAN) IV  4 mg Intravenous Once  . sodium chloride  10-40 mL Intracatheter Q12H   Continuous Infusions: . sodium chloride 100 mL/hr at 12/17/12 0453    Time spent: 35 minutes with > 50% of time spent counseling patient on his diagnosis, cause of DM, treatment options, plan of care.   LOS: 2 days   RAMA,CHRISTINA  [facility] Pager [phone].   *Please note that the hospitalists switch teams on Wednesdays. Please call the flow manager at [phone] if you are having difficulty reaching the hospitalist taking care of this patient as she can update you and provide the most up-to-date pager number of provider caring for the patient. If 8PM-8AM, please contact night-coverage at [email or web address], password [facility]  12/17/2012, 8:07 AM

## 2012-12-18 LAB — GLUCOSE, CAPILLARY
Glucose-Capillary: 351 mg/dL — ABNORMAL HIGH (ref 70–99)
Glucose-Capillary: 295 mg/dL — ABNORMAL HIGH (ref 70–99)

## 2012-12-18 MED ORDER — INSULIN ASPART 100 UNIT/ML ~~LOC~~ SOLN
0.0000 [IU] | Freq: Three times a day (TID) | SUBCUTANEOUS | Status: DC
  Administered 2012-12-18: 11 [IU] via SUBCUTANEOUS
  Administered 2012-12-18: 20 [IU] via SUBCUTANEOUS
  Administered 2012-12-18 – 2012-12-19 (×2): 15 [IU] via SUBCUTANEOUS
  Administered 2012-12-19: 7 [IU] via SUBCUTANEOUS
  Administered 2012-12-19: 11 [IU] via SUBCUTANEOUS
  Administered 2012-12-20: 7 [IU] via SUBCUTANEOUS
  Administered 2012-12-20: 11 [IU] via SUBCUTANEOUS

## 2012-12-18 MED ORDER — INSULIN GLARGINE 100 UNIT/ML ~~LOC~~ SOLN
20.0000 [IU] | Freq: Two times a day (BID) | SUBCUTANEOUS | Status: DC
  Administered 2012-12-18 – 2012-12-19 (×3): 20 [IU] via SUBCUTANEOUS
  Filled 2012-12-18 (×3): qty 0.2

## 2012-12-18 MED ORDER — INSULIN ASPART 100 UNIT/ML ~~LOC~~ SOLN
0.0000 [IU] | Freq: Every day | SUBCUTANEOUS | Status: DC
  Administered 2012-12-18 – 2012-12-19 (×2): 4 [IU] via SUBCUTANEOUS

## 2012-12-18 MED ORDER — INSULIN ASPART 100 UNIT/ML ~~LOC~~ SOLN
6.0000 [IU] | Freq: Three times a day (TID) | SUBCUTANEOUS | Status: DC
  Administered 2012-12-18 (×3): 6 [IU] via SUBCUTANEOUS

## 2012-12-18 NOTE — Plan of Care (Signed)
Problem: Phase I Progression Outcomes Goal: Other Phase I Outcomes/Goals Patient has watched Video 501 - diabetes

## 2012-12-18 NOTE — Care Management Note (Signed)
Cm spoke with patient at the bedside concerning PCP and medication assistance. Pt states recently applied for Medicaid. Pt unlikely to be approved for government insurance due to not being disabled. Pt currently unemployed, resides with girlfriend present at bedside. Cm provided pt with information concerning [facility]. Pt encouraged to schedule an appointment to establish PCP for diabetes management. Pt eligible for [facility] ASSISTANCE upon discharge.  Roxy Manns Wattley,RN,MSN [phone]

## 2012-12-18 NOTE — Progress Notes (Signed)
[facility] PROGRESS NOTE  Justin Wilkinson ION:[id number] DOB: 06/11/1980 DOA: 12/15/2012 PCP: No primary provider on file.  Brief narrative: Justin Wilkinson is an 32 y.o. male with no significant PMH who was admitted to the critical care service on 12/15/2012 in DKA and with altered mental status. His presenting glucose was 1600 with metabolic acidosis, acute renal failure and high anion gap. His DKA resolved with vigorous hydration and an insulin drip.  Assessment/Plan: Principal Problem:   DKA (diabetic ketoacidoses) -Treated with aggressive IV fluids and an insulin drip. Now on basal/bolus insulin. -Seen by diabetes coordinator 12/16/2012. Diabetes education initiated.  Spoke with him at length about the disease, it's cause, and management. -Hemoglobin A1c 11.9% corresponding to a mean plasma glucose of 295. Check islet cell antibodies rule out type 1 diabetes. -CBGs to 85-341. Current insulin therapy: Lantus 15 twice a day and moderate sensitivity insulin sliding scale with meal coverage. Increased Lantus to 20 units twice a day and change to resistant scale with 6 units of NovoLog before each meal.  Increase IVF to 150 cc/hr until glycemic control improved. Active Problems:   AKI (acute kidney injury) -Resolving status post aggressive IV fluids.   Acute encephalopathy -Appears to be back to baseline.   Hypokalemia -Corrected with aggressive replacement.   Essential hypertension, benign -Will likely need therapy. If renal function remained stable and 24 hours, would start ACE or ARB.   Dehydration secondary to osmotic diuresis with hypernatremia and acute renal failure -Hypernatremia resolved. Acute renal failure resolving.   Hypophosphatemia -Replacing.   Leukocytosis, unspecified -May be a stress response. No obvious source of infection. -Blood cultures negative to date.  Code Status: Full. Family Communication: No family at bedside. Disposition Plan: Home in next 24  hours.   Medical Consultants:  Dr. Cyril Mourning, Critical care  Other Consultants:  Diabetes coordinator  Anti-infectives:  None.  HPI/Subjective: Justin Wilkinson feels well except for ongoing complaints of dry mouth.  Continues to be mentally clear, oriented.    Objective: Filed Vitals:   12/17/12 1621 12/17/12 2115 12/18/12 0221 12/18/12 0523  BP: 160/84 151/90 138/80 148/88  Pulse: 98 97 102 101  Temp: 98.2 F (36.8 C) 98.3 F (36.8 C) 98.6 F (37 C) 98.3 F (36.8 C)  TempSrc: Oral Oral Oral Oral  Resp: 18 18 18 18   Height:      Weight:      SpO2: 92% 94% 96% 96%    Intake/Output Summary (Last 24 hours) at 12/18/12 1121 Last data filed at 12/18/12 0843  Gross per 24 hour  Intake 2056.67 ml  Output    600 ml  Net 1456.67 ml    Exam: Gen:  NAD Cardiovascular:  RRR, No M/R/G Respiratory:  Lungs CTAB Gastrointestinal:  Abdomen soft, NT/ND, + BS Extremities:  No C/E/C  Data Reviewed: Basic Metabolic Panel:  Recent Labs Lab 12/15/12 0957  12/15/12 2000 12/16/12 0055 12/16/12 0500 12/16/12 0517 12/16/12 1430 12/17/12 0435  NA 136  < > 159* 165*  --  166* 152* 145  K 3.4*  < > 2.4* 2.7*  --  3.3* 5.1 3.7  CL 88*  < > 122* >130*  --  >130* 118* 111  CO2 8*  < > 26 27  --  26 23 24   GLUCOSE 1656*  < > 649* 330*  --  164* 490* 340*  BUN 53*  < > 43* 37*  --  35* 34* 28*  CREATININE 2.84*  < > 2.31*  2.01*  --  1.85* 1.68* 1.24  CALCIUM 10.6*  < > 9.0 8.8  --  9.3 8.4 8.4  MG 4.5*  --   --   --   --   --   --  2.5  PHOS  --   --   --   --  1.4*  --   --  1.8*  < > = values in this interval not displayed. GFR Estimated Creatinine Clearance: 92.5 ml/min (by C-G formula based on Cr of 1.24). Liver Function Tests:  Recent Labs Lab 12/15/12 0957  AST 26  ALT 36  ALKPHOS 91  BILITOT 0.2*  PROT 8.5*  ALBUMIN 4.1   CBC:  Recent Labs Lab 12/15/12 1130 12/16/12 0500 12/17/12 0435  WBC 22.4* 21.1* 16.7*  NEUTROABS 19.3*  --   --   HGB 16.2 16.9  13.3  HCT 51.2 47.8 37.7*  MCV 98.1 86.1 86.5  PLT 347 289 200   CBG:  Recent Labs Lab 12/17/12 1120 12/17/12 1617 12/17/12 2110 12/18/12 0351 12/18/12 0733  GLUCAP 336* 308* 322* 285* 341*   Microbiology Recent Results (from the past 240 hour(s))  CULTURE, BLOOD (ROUTINE X 2)     Status: None   Collection Time    12/15/12 12:25 PM      Result Value Range Status   Specimen Description BLOOD LEFT ARM   Final   Special Requests BOTTLES DRAWN AEROBIC AND ANAEROBIC 4CC   Final   Culture  Setup Time     Final   Value: 12/15/2012 15:04     Performed at [facility]   Culture     Final   Value:        BLOOD CULTURE RECEIVED NO GROWTH TO DATE CULTURE WILL BE HELD FOR 5 DAYS BEFORE ISSUING A FINAL NEGATIVE REPORT     Performed at [facility]   Report Status PENDING   Incomplete  CULTURE, BLOOD (ROUTINE X 2)     Status: None   Collection Time    12/15/12 12:25 PM      Result Value Range Status   Specimen Description BLOOD LEFT IJ   Final   Special Requests BOTTLES DRAWN AEROBIC AND ANAEROBIC 5CC   Final   Culture  Setup Time     Final   Value: 12/15/2012 15:04     Performed at [facility]   Culture     Final   Value:        BLOOD CULTURE RECEIVED NO GROWTH TO DATE CULTURE WILL BE HELD FOR 5 DAYS BEFORE ISSUING A FINAL NEGATIVE REPORT     Performed at [facility]   Report Status PENDING   Incomplete  MRSA PCR SCREENING     Status: None   Collection Time    12/15/12  2:15 PM      Result Value Range Status   MRSA by PCR NEGATIVE  NEGATIVE Final   Comment:            The GeneXpert MRSA Assay (FDA     approved for NASAL specimens     only), is one component of a     comprehensive MRSA colonization     surveillance program. It is not     intended to diagnose MRSA     infection nor to guide or     monitor treatment for     MRSA infections.     Procedures and Diagnostic Studies: Dg Chest Portable 1  View  12/15/2012   *RADIOLOGY REPORT*   Clinical Data: Weakness  PORTABLE CHEST - 1 VIEW  Comparison: None.  Findings: Cardiomediastinal silhouette appears normal.  No acute pulmonary disease is noted.  Bony thorax is intact.  Left internal jugular venous catheter is noted with tip in expected position of the left brachiocephalic vein. No pneumothorax or pleural effusion is noted.  IMPRESSION: No acute cardiopulmonary abnormality seen.   Original Report Authenticated By: Lupita Raider.,  M.D.    Scheduled Meds: . heparin  5,000 Units Subcutaneous Q8H  . insulin aspart  0-20 Units Subcutaneous TID WC  . insulin aspart  0-5 Units Subcutaneous QHS  . insulin aspart  6 Units Subcutaneous TID WC  . insulin glargine  20 Units Subcutaneous BID  . ondansetron (ZOFRAN) IV  4 mg Intravenous Once  . sodium chloride  10-40 mL Intracatheter Q12H   Continuous Infusions: . sodium chloride 100 mL/hr at 12/18/12 1102    Time spent: 25 minutes.   LOS: 3 days   RAMA,CHRISTINA  [facility] Pager [phone].   *Please note that the hospitalists switch teams on Wednesdays. Please call the flow manager at [phone] if you are having difficulty reaching the hospitalist taking care of this patient as she can update you and provide the most up-to-date pager number of provider caring for the patient. If 8PM-8AM, please contact night-coverage at [email or web address], password [facility]  12/18/2012, 11:21 AM

## 2012-12-19 LAB — GLUCOSE, CAPILLARY
Glucose-Capillary: 296 mg/dL — ABNORMAL HIGH (ref 70–99)
Glucose-Capillary: 297 mg/dL — ABNORMAL HIGH (ref 70–99)
Glucose-Capillary: 301 mg/dL — ABNORMAL HIGH (ref 70–99)

## 2012-12-19 LAB — CBC
MCV: 87.2 fL (ref 78.0–100.0)
Platelets: 182 10*3/uL (ref 150–400)
RDW: 13.2 % (ref 11.5–15.5)
WBC: 9.9 10*3/uL (ref 4.0–10.5)

## 2012-12-19 LAB — RENAL FUNCTION PANEL
Albumin: 2.9 g/dL — ABNORMAL LOW (ref 3.5–5.2)
Chloride: 103 mEq/L (ref 96–112)
Creatinine, Ser: 0.87 mg/dL (ref 0.50–1.35)
GFR calc non Af Amer: >90 mL/min (ref >90)
Potassium: 3.9 mEq/L (ref 3.5–5.1)
Sodium: 139 mEq/L (ref 135–145)

## 2012-12-19 MED ORDER — INSULIN GLARGINE 100 UNIT/ML ~~LOC~~ SOLN
30.0000 [IU] | Freq: Two times a day (BID) | SUBCUTANEOUS | Status: DC
  Administered 2012-12-19: 30 [IU] via SUBCUTANEOUS
  Filled 2012-12-19 (×2): qty 0.3

## 2012-12-19 MED ORDER — INSULIN ASPART 100 UNIT/ML ~~LOC~~ SOLN
8.0000 [IU] | Freq: Three times a day (TID) | SUBCUTANEOUS | Status: DC
  Administered 2012-12-19 (×2): 8 [IU] via SUBCUTANEOUS

## 2012-12-19 MED ORDER — LISINOPRIL 20 MG PO TABS
20.0000 mg | ORAL_TABLET | Freq: Every day | ORAL | Status: DC
  Administered 2012-12-19 – 2012-12-20 (×2): 20 mg via ORAL
  Filled 2012-12-19 (×2): qty 1

## 2012-12-19 NOTE — Progress Notes (Signed)
[facility] PROGRESS NOTE  Justin Wilkinson:[id number] DOB: Jan 08, 1981 DOA: 12/15/2012 PCP: No primary provider on file.  Brief narrative: Justin Wilkinson is an 32 y.o. male with no significant PMH who was admitted to the critical care service on 12/15/2012 in DKA and with altered mental status. His presenting glucose was 1600 with metabolic acidosis, acute renal failure and high anion gap. His DKA resolved with vigorous hydration and an insulin drip.  Assessment/Plan: Principal Problem:   DKA (diabetic ketoacidoses) -Treated with aggressive IV fluids and an insulin drip. Now on basal/bolus insulin. -Seen by diabetes coordinator 12/16/2012. Diabetes education initiated.  Spoke with him at length about the disease, it's cause, and management. -Hemoglobin A1c 11.9% corresponding to a mean plasma glucose of 295. Check islet cell antibodies rule out type 1 diabetes. -CBGs 285-341. Current insulin therapy: Lantus to 20 units twice a day, SSI resistant scale with 6 units of NovoLog before each meal.  Increase Lantus to 30 units BID and increase meal coverage to 8 units Q AC. -Continue IVF at 150 cc/hr until glycemic control improved. Active Problems:   AKI (acute kidney injury) -Resolved status post aggressive IV fluids.   Acute encephalopathy -Appears to be back to baseline.   Hypokalemia -Corrected with aggressive replacement.   Essential hypertension, benign -Will likely need therapy. Start Lisinopril.   Dehydration secondary to osmotic diuresis with hypernatremia and acute renal failure -Hypernatremia resolved. Acute renal failure resolved.   Hypophosphatemia -Replaced.   Leukocytosis, unspecified -May be a stress response. No obvious source of infection. -Blood cultures negative to date. -WBC now WNL.  Code Status: Full. Family Communication: Wife at bedside. Disposition Plan: Home in next 24 hours.   Medical Consultants:  Dr. Cyril Mourning, Critical care  Other  Consultants:  Diabetes coordinator  Anti-infectives:  None.  HPI/Subjective: Justin Wilkinson feels well except for ongoing complaints of dry mouth.  Continues to be mentally clear, oriented.  No fevers, nausea or vomiting.  Objective: Filed Vitals:   12/18/12 1403 12/18/12 2151 12/19/12 0158 12/19/12 0544  BP: 135/79 157/92 152/86 157/83  Pulse: 110 100 96 102  Temp: 98.3 F (36.8 C) 98.9 F (37.2 C) 98.7 F (37.1 C) 98.7 F (37.1 C)  TempSrc: Oral Oral Oral Oral  Resp: 18 18 18 18   Height:      Weight:      SpO2: 95% 95% 99% 96%    Intake/Output Summary (Last 24 hours) at 12/19/12 0806 Last data filed at 12/19/12 0157  Gross per 24 hour  Intake     20 ml  Output   2775 ml  Net  -2755 ml    Exam: Gen:  NAD Cardiovascular:  RRR, No M/R/G Respiratory:  Lungs CTAB Gastrointestinal:  Abdomen soft, NT/ND, + BS Extremities:  No C/E/C  Data Reviewed: Basic Metabolic Panel:  Recent Labs Lab 12/15/12 0957  12/16/12 0055 12/16/12 0500 12/16/12 0517 12/16/12 1430 12/17/12 0435 12/19/12 0500  NA 136  < > 165*  --  166* 152* 145 139  K 3.4*  < > 2.7*  --  3.3* 5.1 3.7 3.9  CL 88*  < > >130*  --  >130* 118* 111 103  CO2 8*  < > 27  --  26 23 24 24   GLUCOSE 1656*  < > 330*  --  164* 490* 340* 284*  BUN 53*  < > 37*  --  35* 34* 28* 19  CREATININE 2.84*  < > 2.01*  --  1.85*  1.68* 1.24 0.87  CALCIUM 10.6*  < > 8.8  --  9.3 8.4 8.4 9.7  MG 4.5*  --   --   --   --   --  2.5  --   PHOS  --   --   --  1.4*  --   --  1.8* 4.4  < > = values in this interval not displayed. GFR Estimated Creatinine Clearance: 131.9 ml/min (by C-G formula based on Cr of 0.87). Liver Function Tests:  Recent Labs Lab 12/15/12 0957 12/19/12 0500  AST 26  --   ALT 36  --   ALKPHOS 91  --   BILITOT 0.2*  --   PROT 8.5*  --   ALBUMIN 4.1 2.9*   CBC:  Recent Labs Lab 12/15/12 1130 12/16/12 0500 12/17/12 0435 12/19/12 0513  WBC 22.4* 21.1* 16.7* 9.9  NEUTROABS 19.3*  --   --    --   HGB 16.2 16.9 13.3 12.9*  HCT 51.2 47.8 37.7* 37.5*  MCV 98.1 86.1 86.5 87.2  PLT 347 289 200 182   CBG:  Recent Labs Lab 12/18/12 0351 12/18/12 0733 12/18/12 1149 12/18/12 1626 12/18/12 1957  GLUCAP 285* 341* 351* 295* 331*   Microbiology Recent Results (from the past 240 hour(s))  CULTURE, BLOOD (ROUTINE X 2)     Status: None   Collection Time    12/15/12 12:25 PM      Result Value Range Status   Specimen Description BLOOD LEFT ARM   Final   Special Requests BOTTLES DRAWN AEROBIC AND ANAEROBIC 4CC   Final   Culture  Setup Time     Final   Value: 12/15/2012 15:04     Performed at [facility]   Culture     Final   Value:        BLOOD CULTURE RECEIVED NO GROWTH TO DATE CULTURE WILL BE HELD FOR 5 DAYS BEFORE ISSUING A FINAL NEGATIVE REPORT     Performed at [facility]   Report Status PENDING   Incomplete  CULTURE, BLOOD (ROUTINE X 2)     Status: None   Collection Time    12/15/12 12:25 PM      Result Value Range Status   Specimen Description BLOOD LEFT IJ   Final   Special Requests BOTTLES DRAWN AEROBIC AND ANAEROBIC 5CC   Final   Culture  Setup Time     Final   Value: 12/15/2012 15:04     Performed at [facility]   Culture     Final   Value: GRAM POSITIVE RODS     Note: Gram Stain Report Called to,Read Back By and Verified With: Cindra Eves 12/18/12 224P MITCV     Performed at [facility]   Report Status PENDING   Incomplete  MRSA PCR SCREENING     Status: None   Collection Time    12/15/12  2:15 PM      Result Value Range Status   MRSA by PCR NEGATIVE  NEGATIVE Final   Comment:            The GeneXpert MRSA Assay (FDA     approved for NASAL specimens     only), is one component of a     comprehensive MRSA colonization     surveillance program. It is not     intended to diagnose MRSA     infection nor to guide or     monitor treatment for  MRSA infections.     Procedures and Diagnostic Studies: Dg Chest  Portable 1 View  12/15/2012   *RADIOLOGY REPORT*  Clinical Data: Weakness  PORTABLE CHEST - 1 VIEW  Comparison: None.  Findings: Cardiomediastinal silhouette appears normal.  No acute pulmonary disease is noted.  Bony thorax is intact.  Left internal jugular venous catheter is noted with tip in expected position of the left brachiocephalic vein. No pneumothorax or pleural effusion is noted.  IMPRESSION: No acute cardiopulmonary abnormality seen.   Original Report Authenticated By: Lupita Raider.,  M.D.    Scheduled Meds: . heparin  5,000 Units Subcutaneous Q8H  . insulin aspart  0-20 Units Subcutaneous TID WC  . insulin aspart  0-5 Units Subcutaneous QHS  . insulin aspart  6 Units Subcutaneous TID WC  . insulin glargine  20 Units Subcutaneous BID  . ondansetron (ZOFRAN) IV  4 mg Intravenous Once  . sodium chloride  10-40 mL Intracatheter Q12H   Continuous Infusions: . sodium chloride 150 mL/hr at 12/18/12 1217    Time spent: 25 minutes.   LOS: 4 days   Nyjah Schwake  [facility] Pager [phone].   *Please note that the hospitalists switch teams on Wednesdays. Please call the flow manager at [phone] if you are having difficulty reaching the hospitalist taking care of this patient as she can update you and provide the most up-to-date pager number of provider caring for the patient. If 8PM-8AM, please contact night-coverage at [email or web address], password [facility]  12/19/2012, 8:06 AM

## 2012-12-19 NOTE — Progress Notes (Addendum)
Inpatient Diabetes Program Recommendations  AACE/ADA: New Consensus Statement on Inpatient Glycemic Control (2013)  Target Ranges:  Prepandial:   less than 140 mg/dL      Peak postprandial:   less than 180 mg/dL (1-2 hours)      Critically ill patients:  140 - 180 mg/dL     Results for Justin Wilkinson, Justin Wilkinson (MRN [id number]) as of 12/19/2012 09:56  Ref. Range 12/18/2012 07:33 12/18/2012 11:49 12/18/2012 16:26 12/18/2012 19:57  Glucose-Capillary Latest Range: 70-99 mg/dL 409 (H) 811 (H) 914 (H) 331 (H)    Results for Justin Wilkinson, Justin Wilkinson (MRN [id number]) as of 12/19/2012 09:56  Ref. Range 12/19/2012 07:38 12/19/2012 09:20  Glucose-Capillary Latest Range: 70-99 mg/dL 086 (H) 578 (H)    **Admitted with DKA, newly diagnosed diabetes.  A1c 11.9%.  Noted DM Coordinator spoke with patient on evening of 08/15.  RNs providing basic DM education to patient.  Will check to see that insulin education has been started as well.  **Noted Novolog meal coverage increased to 8 units tid with meals today.  Looking back at CBGs from yesterday (08/17), patient received a total of 40 units of Lantus and a total of 68 units of Novolog yesterday.    **Noted patient does not have health insurance.  Will get help from the [facility] program at d/c, however, this will only provide insulin for one month.  Patient will need affordable insulin at d/c since he is a self-pay patient.  Recommend we convert patient to 70/30 at some point so that patient can be discharged home on the [employer] brand of 70/30 insulin (Novolin Reli-on brand of 70/30 insulin at [facility] is $25 per vial with a physician's Rx).    **Based on patient's current CBGs, we could start with 70/30 insulin- 30 units bid with breakfast and supper (this would be equivalent to 42 units of intermediate-acting insulin (NPH-like) and 9 units of Novolog at breakfast and supper).  **MD- Please make sure to give patient a Rx for both insulin and insulin syringes at d/c  Addendum 1508pm:  Spoke with patient earlier this afternoon.  Gave patient information on purchasing an inexpensive CBG meter OTC at [employer] (meter $16, 50 count strips $9).  Reviewed appropriate CBG technique with patient and reminded patient to rotate his insulin injection sites.  Patient stated he has given himself injections here in the hospital and is getting more confortable to the idea of insulin.    Will follow. Ambrose Finland RN, MSN, CDE Diabetes Coordinator Inpatient Diabetes Program [phone]

## 2012-12-19 NOTE — Progress Notes (Signed)
12/19/12 1400  Clinical Encounter Type  Visited With Patient and family together (girlfriend [name] and son Justin Wilkinson (2 w))  Visit Type Initial;Spiritual support;Social support  Referral From Chaplain (via huddle)  Spiritual Encounters  Spiritual Needs Emotional  Stress Factors  Family Stress Factors (lots of transition:  new baby, new dx, start of school year)   Visited with pt and girlfriend Justin Wilkinson, who is very supportive.  Provided reflective listening as they shared and processed their story of recent/upcoming transitions.  Justin Wilkinson is very matter-of-fact about his new diagnosis and lifestyle change, saying that he won't let it get to him or hold him back.  [name] is very organized and encouraging.  They are disappointed to miss out on a hoped-for vacation between now and when [name], Justin Wilkinson's 67-year-old son, starts 1st grade next week, so we discussed opportunities for family fun and bonding (perhaps a staycation) when [name] gets home.  Provided pastoral listening, affirmation of strengths and gifts, and encouragement.  They are aware of ongoing chaplain availability.  [address] [place], [place] [phone]

## 2012-12-19 NOTE — Progress Notes (Signed)
CRITICAL VALUE ALERT  Critical value received:  Aneorobic bottle drawn 12/15/12, is gram + rod   Date of notification:  12/18/12  Time of notification:  2245 pm  Critical value read back:yes  Nurse who received alert:  Cindra Eves RN  MD notified (1st page):  R. Reidler      Time of first page:  23:05 PM  MD notified (2nd page):  Time of second page: 23:58 PM  Responding MD:  Eddie Dibbles  Time MD responded:  12/19/12  At  0004 AM

## 2012-12-19 NOTE — Progress Notes (Signed)
12/19/2012 Vennie Waymire BSN RN CCM [phone] Request from Attending MD regarding availability of lantus and Novolg insulin via [facility] program. Talked with [facility] outpt pharmacy and asst director of CM who advised that medication was available. Attending advised. Pt will have access to glucometer per information from caregiver. CM re-enforced information about following up with [facility].

## 2012-12-20 LAB — GLUCOSE, CAPILLARY: Glucose-Capillary: 276 mg/dL — ABNORMAL HIGH (ref 70–99)

## 2012-12-20 MED ORDER — "INSULIN SYRINGE 28G X 1/2"" 0.5 ML MISC": Status: DC

## 2012-12-20 MED ORDER — INSULIN ASPART 100 UNIT/ML ~~LOC~~ SOLN
10.0000 [IU] | Freq: Three times a day (TID) | SUBCUTANEOUS | Status: DC
  Administered 2012-12-20: 10 [IU] via SUBCUTANEOUS

## 2012-12-20 MED ORDER — INSULIN NPH ISOPHANE & REGULAR (70-30) 100 UNIT/ML ~~LOC~~ SUSP: 35.0000 [IU] | Freq: Two times a day (BID) | SUBCUTANEOUS | Status: DC

## 2012-12-20 MED ORDER — LISINOPRIL 20 MG PO TABS: 20.0000 mg | ORAL_TABLET | Freq: Every day | ORAL | Status: DC

## 2012-12-20 MED ORDER — FREESTYLE SYSTEM KIT: 1.0000 | PACK | Status: DC | PRN

## 2012-12-20 MED ORDER — INSULIN GLARGINE 100 UNIT/ML ~~LOC~~ SOLN
40.0000 [IU] | Freq: Two times a day (BID) | SUBCUTANEOUS | Status: DC
  Administered 2012-12-20: 40 [IU] via SUBCUTANEOUS
  Filled 2012-12-20 (×2): qty 0.4

## 2012-12-20 NOTE — Discharge Summary (Signed)
Physician Discharge Summary  Justin Wilkinson:[id number] DOB: 07-23-1980 DOA: 12/15/2012  PCP: No primary provider on file. referred to the [facility] and [facility].  Admit date: 12/15/2012 Discharge date: 12/20/2012  Recommendations for Outpatient Follow-up:  1. Recommend close followup of glycemic control in this newly diagnosed diabetic. 2. Recommend outpatient check of cholesterol and initiation of treatment if indicated. 3. Followup islet cell antibodies to rule out type 1 diabetes, pending at the time of this dictation.  Discharge Diagnoses:  Principal Problem:    DKA (diabetic ketoacidoses) Active Problems:    AKI (acute kidney injury)    Acute encephalopathy    Hypokalemia    Essential hypertension, benign    Dehydration secondary to osmotic diuresis with hypernatremia and acute renal failure    Hypophosphatemia    Leukocytosis, unspecified    Protein-calorie malnutrition, severe   Discharge Condition: Improved.  Diet recommendation: Carbohydrate modified, low-sodium, heart healthy.  History of present illness:  KENNET Wilkinson is an 32 y.o. male with no significant PMH who was admitted to the critical care service on 12/15/2012 in DKA and with altered mental status. His presenting glucose was 1600 with metabolic acidosis, acute renal failure and high anion gap. His DKA resolved with vigorous hydration and an insulin drip.  Hospital Course by problem:  Principal Problem:  DKA (diabetic ketoacidoses)  -Treated with aggressive IV fluids and an insulin drip. Now on basal/bolus insulin.  -Seen by diabetes coordinator 12/16/2012. Diabetes education initiated. Spoke with him at length about the disease, it's cause, and management.  -Hemoglobin A1c 11.9% corresponding to a mean plasma glucose of 295. Check islet cell antibodies rule out type 1 diabetes.  -Insulin therapy titrated up based on CBG readings and will be discharged on 35 units of 70/30 insulin  twice a day. -Seen by the diabetes coordinator and will be referred to outpatient diabetes education. Active Problems:  AKI (acute kidney injury)  -Resolved status post aggressive IV fluids.  Acute encephalopathy  -Resolved with correction of electrolyte derangements and DKA.  Hypokalemia  -Corrected with aggressive replacement.  Essential hypertension, benign  -Started on Lisinopril.  Dehydration secondary to osmotic diuresis with hypernatremia and acute renal failure  -Hypernatremia resolved. Acute renal failure resolved.  Hypophosphatemia  -Replaced.  Leukocytosis, unspecified  -May be a stress response. No obvious source of infection.  -Blood cultures negative to date.  -WBC now WNL.   Discharge Exam: Filed Vitals:   12/20/12 0543  BP: 105/84  Pulse: 100  Temp: 98.1 F (36.7 C)  Resp: 16   Filed Vitals:   12/19/12 0158 12/19/12 0544 12/19/12 2100 12/20/12 0543  BP: 152/86 157/83 153/96 105/84  Pulse: 96 102 101 100  Temp: 98.7 F (37.1 C) 98.7 F (37.1 C) 98.3 F (36.8 C) 98.1 F (36.7 C)  TempSrc: Oral Oral Oral Oral  Resp: 18 18 18 16   Height:      Weight:      SpO2: 99% 96% 98% 100%    Gen:  NAD Cardiovascular:  RRR, No M/R/G Respiratory: Lungs CTAB Gastrointestinal: Abdomen soft, NT/ND with normal active bowel sounds. Extremities: No C/E/C   Discharge Instructions  Discharge Orders   Future Orders Complete By Expires   Ambulatory referral to Nutrition and Diabetic Education  As directed    Comments:     New onset DM   Call MD for:  extreme fatigue  As directed    Call MD for:  persistant nausea and vomiting  As directed  Call MD for:  As directed    Scheduling Instructions:     Persistently elevated blood glucoses greater than 250 or low blood glucoses less than 70.   Diet - low sodium heart healthy  As directed    Diet Carb Modified  As directed    Discharge instructions  As directed    Comments:     -Check your blood glucoses before  each meal and at bedtime and keep a log of all of your readings. -Your hemoglobin A1c should be monitored every 3 months.  Goal = less than 7%, current value, current value = 11.9%. -Have your MD at the [facility] and [facility] follow up on your antibody test to determine if you are a type 1 or type 2 diabetic. -You will need to control your blood pressure and your cholesterol closely to avoid common diabetic complications such as heart disease, kidney disease and stroke.  You were cared for by Dr. Hillery Aldo  (a hospitalist) during your hospital stay. If you have any questions about your discharge medications or the care you received while you were in the hospital after you are discharged, you can call the unit and ask to speak with the hospitalist on call if the hospitalist that took care of you is not available. Once you are discharged, your primary care physician will handle any further medical issues. Please note that NO REFILLS for any discharge medications will be authorized once you are discharged, as it is imperative that you return to your primary care physician (or establish a relationship with a primary care physician if you do not have one) for your aftercare needs so that they can reassess your need for medications and monitor your lab values.  Any outstanding tests can be reviewed by your PCP at your follow up visit.  It is also important to review any medicine changes with your PCP.  Please bring these d/c instructions with you to your next visit so your physician can review these changes with you.  If you do not have a primary care physician, you can call [phone] for a physician referral.  It is highly recommended that you obtain a PCP for hospital follow up.   Increase activity slowly  As directed        Medication List         glucose monitoring kit monitoring kit  1 each by Does not apply route as needed for other. May substitute cheapest option.  Please provide  diabetic testing supplies, lancets.     insulin NPH-regular (70-30) 100 UNIT/ML injection  Commonly known as:  NOVOLIN 70/30  Inject 35 Units into the skin 2 (two) times daily with a meal.     INSULIN SYRINGE .5CC/28G 28G X 1/2" 0.5 ML Misc  Check blood sugars before each meal and at bedtime.  Keep a log.     lisinopril 20 MG tablet  Commonly known as:  PRINIVIL,ZESTRIL  Take 1 tablet (20 mg total) by mouth daily.     multivitamin with minerals Tabs tablet  Take 1 tablet by mouth daily.           Follow-up Information   Follow up with [facility]    . Schedule an appointment as soon as possible for a visit in 1 week. [phone] call for an appointment)    Contact information:   [address] [place] [address]        The results  of significant diagnostics from this hospitalization (including imaging, microbiology, ancillary and laboratory) are listed below for reference.    Significant Diagnostic Studies: Dg Chest Portable 1 View  12/15/2012   *RADIOLOGY REPORT*  Clinical Data: Weakness  PORTABLE CHEST - 1 VIEW  Comparison: None.  Findings: Cardiomediastinal silhouette appears normal.  No acute pulmonary disease is noted.  Bony thorax is intact.  Left internal jugular venous catheter is noted with tip in expected position of the left brachiocephalic vein. No pneumothorax or pleural effusion is noted.  IMPRESSION: No acute cardiopulmonary abnormality seen.   Original Report Authenticated By: Lupita Raider.,  M.D.    Labs:  Basic Metabolic Panel:  Recent Labs Lab 12/15/12 0957  12/16/12 0055 12/16/12 0500 12/16/12 0517 12/16/12 1430 12/17/12 0435 12/19/12 0500  NA 136  < > 165*  --  166* 152* 145 139  K 3.4*  < > 2.7*  --  3.3* 5.1 3.7 3.9  CL 88*  < > >130*  --  >130* 118* 111 103  CO2 8*  < > 27  --  26 23 24 24   GLUCOSE 1656*  < > 330*  --  164* 490* 340* 284*  BUN 53*  < > 37*  --  35* 34* 28* 19  CREATININE 2.84*  < >  2.01*  --  1.85* 1.68* 1.24 0.87  CALCIUM 10.6*  < > 8.8  --  9.3 8.4 8.4 9.7  MG 4.5*  --   --   --   --   --  2.5  --   PHOS  --   --   --  1.4*  --   --  1.8* 4.4  < > = values in this interval not displayed. GFR Estimated Creatinine Clearance: 131.9 ml/min (by C-G formula based on Cr of 0.87). Liver Function Tests:  Recent Labs Lab 12/15/12 0957 12/19/12 0500  AST 26  --   ALT 36  --   ALKPHOS 91  --   BILITOT 0.2*  --   PROT 8.5*  --   ALBUMIN 4.1 2.9*   CBC:  Recent Labs Lab 12/15/12 1130 12/16/12 0500 12/17/12 0435 12/19/12 0513  WBC 22.4* 21.1* 16.7* 9.9  NEUTROABS 19.3*  --   --   --   HGB 16.2 16.9 13.3 12.9*  HCT 51.2 47.8 37.7* 37.5*  MCV 98.1 86.1 86.5 87.2  PLT 347 289 200 182   CBG:  Recent Labs Lab 12/19/12 1155 12/19/12 1650 12/19/12 2152 12/20/12 0740 12/20/12 1136  GLUCAP 297* 311* 301* 230* 276*   Microbiology Recent Results (from the past 240 hour(s))  CULTURE, BLOOD (ROUTINE X 2)     Status: None   Collection Time    12/15/12 12:25 PM      Result Value Range Status   Specimen Description BLOOD LEFT ARM   Final   Special Requests BOTTLES DRAWN AEROBIC AND ANAEROBIC 4CC   Final   Culture  Setup Time     Final   Value: 12/15/2012 15:04     Performed at [facility]   Culture     Final   Value:        BLOOD CULTURE RECEIVED NO GROWTH TO DATE CULTURE WILL BE HELD FOR 5 DAYS BEFORE ISSUING A FINAL NEGATIVE REPORT     Performed at [facility]   Report Status PENDING   Incomplete  CULTURE, BLOOD (ROUTINE X 2)     Status: None   Collection Time  12/15/12 12:25 PM      Result Value Range Status   Specimen Description BLOOD LEFT IJ   Final   Special Requests BOTTLES DRAWN AEROBIC AND ANAEROBIC 5CC   Final   Culture  Setup Time     Final   Value: 12/15/2012 15:04     Performed at [facility]   Culture     Final   Value: PROPIONIBACTERIUM SPECIES     Note: Gram Stain Report Called to,Read Back By and Verified  With: Cindra Eves 12/18/12 224P MITCV     Performed at [facility]   Report Status PENDING   Incomplete  MRSA PCR SCREENING     Status: None   Collection Time    12/15/12  2:15 PM      Result Value Range Status   MRSA by PCR NEGATIVE  NEGATIVE Final   Comment:            The GeneXpert MRSA Assay (FDA     approved for NASAL specimens     only), is one component of a     comprehensive MRSA colonization     surveillance program. It is not     intended to diagnose MRSA     infection nor to guide or     monitor treatment for     MRSA infections.    Time coordinating discharge: 40 minutes.  Signed:  Townes Fuhs  Pager [phone] [facility] 12/20/2012, 2:55 PM

## 2012-12-20 NOTE — Progress Notes (Signed)
Nutrition Education Note  Pt meets criteria for severe MALNUTRITION in the context of acute illness as evidenced by <50% estimated energy intake with 20.7% weight loss in the past 2 weeks per pt report.  Pt reports very little intake for the past 2 weeks with 50 pound unintended weight loss during this time frame. Pt states he was doing a lot of drinking of fluids, particularly Kool-aid. Reports when he was eating, it was only 1 meal/day and admits to consuming excess carbohydrate portion sizes.   RD consulted for nutrition education regarding diabetes.   Lab Results  Component Value Date   HGBA1C 11.9* 12/17/2012    RD provided "Carbohydrate Counting for People with Diabetes" handout from the Academy of Nutrition and Dietetics. Discussed different food groups and their effects on blood sugar, emphasizing carbohydrate-containing foods. Provided list of carbohydrates and recommended serving sizes of common foods.  Discussed importance of controlled and consistent carbohydrate intake throughout the day. Provided examples of ways to balance meals/snacks and encouraged intake of high-fiber, whole grain complex carbohydrates. Teach back method used.  Expect good compliance.  Body mass index is 26.44 kg/(m^2). Pt meets criteria for oveweight based on current BMI.  Current diet order is CHO modified, patient is consuming approximately 95% of meals at this time. Labs and medications reviewed. No further nutrition interventions warranted at this time. RD contact information provided. If additional nutrition issues arise, please re-consult RD.  Will order outpatient RD follow up at [facility].   Levon Hedger MS, RD, LDN [phone] Pager [phone] After Hours Pager

## 2012-12-20 NOTE — Care Management Note (Signed)
    Page 1 of 2   12/20/2012     3:56:08 PM   CARE MANAGEMENT NOTE 12/20/2012  Patient:  Justin Wilkinson, Justin Wilkinson   Account Number:  [id number]  Date Initiated:  12/18/2012  Documentation initiated by:  Lunsford,TYMEEKA  Subjective/Objective Assessment:   32 yo male admitted with  DKA (diabetic ketoacidoses). No PCP on record.     Action/Plan:   Home when stable   Anticipated DC Date:  12/19/2012   Anticipated DC Plan:  HOME/SELF CARE  In-house referral  Artist  PCP / [facility]      DC Planning Services  CM consult  MATCH Program  [facility]      Choice offered to / List presented to:  NA   DME arranged  NA      DME agency  NA     HH arranged  NA      Status of service:  Completed, signed off Medicare Important Message given?   (If response is "NO", the following Medicare IM given date fields will be blank) Date Medicare IM given:   Date Additional Medicare IM given:    Discharge Disposition:  HOME/SELF CARE  Per UR Regulation:  Reviewed for med. necessity/level of care/duration of stay  If discussed at Long Length of Stay Meetings, dates discussed:    Comments:  12/20/2012 Colleen Can BSN RN CCM [phone] Pt given letter to obtain medications via [facility] program from outpatient pharmacy. Pt's caregiver will get glucometer and syringes from low cost pharmacy. Information for patient assistance program for insulin given to patient. Needymeds.com website offered to patient also.Pt voices understanding of needing to get f/u appt at [facility] and [facility]. Girlfriend will call to get appt for 7 day f/u.    12/19/2012 Colleen Can BSN RN CCM [phone] Request from Attending MD regarding availability of lantus and Novolg insulin via [facility] program. Talked with [facility] outpt pharmacy and asst director of CM who advised that medication was available. Attending advised. Pt will have access to glucometer per information from caregiver. CM  re-enforced information about following up with [facility].   Sharmon Leyden, RN Registered Nurse Signed CASE MANAGEMENT Care Management Note Service date: 12/18/2012 1:57 PM Cm spoke with patient at the bedside concerning PCP and medication assistance. Pt states recently applied for Medicaid. Pt unlikely to be approved for government insurance due to not being disabled. Pt currently unemployed, resides with girlfriend present at bedside. Cm provided pt with information concerning [facility]. Pt encouraged to schedule an appointment to establish PCP for diabetes management. Pt eligible for [facility] ASSISTANCE upon discharge.

## 2012-12-21 LAB — CULTURE, BLOOD (ROUTINE X 2)

## 2012-12-21 LAB — GLUTAMIC ACID DECARBOXYLASE AUTO ABS: Glutamic Acid Decarb Ab: <1 U/mL (ref <=1.0)

## 2012-12-23 ENCOUNTER — Telehealth (HOSPITAL_COMMUNITY): Payer: Self-pay | Admitting: Physician Assistant

## 2012-12-23 NOTE — Telephone Encounter (Signed)
Called by patient's family member for the patient.  CBG last night was 496.  The lowest CBG they have seen since discharge on 8/19 is 280.  Currently on Novolin 35 units BID.  PCP appointment scheduled for 9/2.  No signs of infection.    Advised patient to go up to 40 units BID on novolog and continue to monitor cbgs.  If CBGs drop to 100 or less change to 37 units until seen by PCP on 9/2.  Algis Downs, PA-C [facility] Pager: [phone]

## 2013-01-03 ENCOUNTER — Ambulatory Visit: Payer: Medicaid Other | Attending: Internal Medicine | Admitting: Internal Medicine

## 2013-01-03 VITALS — BP 133/84 | HR 69 | Temp 97.8°F | Resp 17 | Wt 203.2 lb

## 2013-01-03 DIAGNOSIS — E119 Type 2 diabetes mellitus without complications: Secondary | ICD-10-CM | POA: Insufficient documentation

## 2013-01-03 DIAGNOSIS — I10 Essential (primary) hypertension: Secondary | ICD-10-CM

## 2013-01-03 DIAGNOSIS — E1010 Type 1 diabetes mellitus with ketoacidosis without coma: Secondary | ICD-10-CM | POA: Insufficient documentation

## 2013-01-03 DIAGNOSIS — IMO0002 Reserved for concepts with insufficient information to code with codable children: Secondary | ICD-10-CM | POA: Insufficient documentation

## 2013-01-03 DIAGNOSIS — E1065 Type 1 diabetes mellitus with hyperglycemia: Secondary | ICD-10-CM

## 2013-01-03 LAB — BASIC METABOLIC PANEL
BUN: 13 mg/dL (ref 6–23)
CO2: 27 mEq/L (ref 19–32)
Calcium: 10.4 mg/dL (ref 8.4–10.5)
Glucose, Bld: 231 mg/dL — ABNORMAL HIGH (ref 70–99)
Potassium: 4.6 mEq/L (ref 3.5–5.3)
Sodium: 137 mEq/L (ref 135–145)

## 2013-01-03 MED ORDER — PANTOPRAZOLE SODIUM 40 MG PO TBEC: 40.0000 mg | DELAYED_RELEASE_TABLET | Freq: Every day | ORAL | Status: DC

## 2013-01-03 MED ORDER — INSULIN NPH ISOPHANE & REGULAR (70-30) 100 UNIT/ML ~~LOC~~ SUSP: 45.0000 [IU] | Freq: Two times a day (BID) | SUBCUTANEOUS | Status: DC

## 2013-01-03 MED ORDER — GLUCOSE BLOOD VI STRP: ORAL_STRIP | Status: DC

## 2013-01-03 MED ORDER — ALCOHOL SWABS PADS: 1.0000 | MEDICATED_PAD | Freq: Two times a day (BID) | Status: AC

## 2013-01-03 MED ORDER — FINGERSTIX LANCETS MISC: 1.0000 | Freq: Two times a day (BID) | Status: DC

## 2013-01-03 NOTE — Progress Notes (Signed)
Patient ID: Justin Wilkinson, male   DOB: [birth year], 32 y.o.   MRN: [id number]  CC: new diabetes  HPI: 32 year old male with past medical history of newly diagnosed diabetes who presented to a clinic to establish primary care physician. Patient reports no complaints at this time. No lightheadedness or blurry vision or headaches. No polydipsia or polyuria or polyphagia. Patient takes insulin NovoLog 70/30 35 units twice daily alternating with 40 units occasionally is blood sugars are high. Patient is compliant with newly started normal regimen. No other complaints.  Allergies  Allergen Reactions  . Fish Allergy Anaphylaxis  . Shellfish Allergy Anaphylaxis   Past Medical History  Diagnosis Date  . Diabetes mellitus without complication    Current Outpatient Prescriptions on File Prior to Visit  Medication Sig Dispense Refill  . glucose monitoring kit (FREESTYLE) monitoring kit 1 each by Does not apply route as needed for other. May substitute cheapest option.  Please provide diabetic testing supplies, lancets.  1 each  12  . lisinopril (PRINIVIL,ZESTRIL) 20 MG tablet Take 1 tablet (20 mg total) by mouth daily.  30 tablet  5  . Multiple Vitamin (MULTIVITAMIN WITH MINERALS) TABS tablet Take 1 tablet by mouth daily.       No current facility-administered medications on file prior to visit.   Family History  Problem Relation Age of Onset  . Lung cancer Father    History   Social History  . Marital Status: Single    Spouse Name: N/A    Number of Children: N/A  . Years of Education: N/A   Occupational History  . Not on file.   Social History Main Topics  . Smoking status: Current Every Day Smoker -- 0.50 packs/day    Types: Cigarettes  . Smokeless tobacco: Never Used  . Alcohol Use: No  . Drug Use: No  . Sexual Activity: No   Other Topics Concern  . Not on file   Social History Narrative  . No narrative on file    Review of Systems  Constitutional: Negative for fever,  chills, diaphoresis, activity change, appetite change and fatigue.  HENT: Negative for ear pain, nosebleeds, congestion, facial swelling, rhinorrhea, neck pain, neck stiffness and ear discharge.   Eyes: Negative for pain, discharge, redness, itching and visual disturbance.  Respiratory: Negative for cough, choking, chest tightness, shortness of breath, wheezing and stridor.   Cardiovascular: Negative for chest pain, palpitations and leg swelling.  Gastrointestinal: Negative for abdominal distention.  Genitourinary: Negative for dysuria, urgency, frequency, hematuria, flank pain, decreased urine volume, difficulty urinating and dyspareunia.  Musculoskeletal: Negative for back pain, joint swelling, arthralgias and gait problem.  Neurological: Negative for dizziness, tremors, seizures, syncope, facial asymmetry, speech difficulty, weakness, light-headedness, numbness and headaches.  Hematological: Negative for adenopathy. Does not bruise/bleed easily.  Psychiatric/Behavioral: Negative for hallucinations, behavioral problems, confusion, dysphoric mood, decreased concentration and agitation.    Objective:   Filed Vitals:   01/03/13 1037  BP: 133/84  Pulse: 69  Temp: 97.8 F (36.6 C)  Resp: 17    Physical Exam  Constitutional: Appears well-developed and well-nourished. No distress.  HENT: Normocephalic. External right and left ear normal. Oropharynx is clear and moist.  Eyes: Conjunctivae and EOM are normal. PERRLA, no scleral icterus.  Neck: Normal ROM. Neck supple. No JVD. No tracheal deviation. No thyromegaly.  CVS: RRR, S1/S2 +, no murmurs, no gallops, no carotid bruit.  Pulmonary: Effort and breath sounds normal, no stridor, rhonchi, wheezes, rales.  Abdominal: Soft. BS +,  no distension, tenderness, rebound or guarding.  Musculoskeletal: Normal range of motion. No edema and no tenderness.  Lymphadenopathy: No lymphadenopathy noted, cervical, inguinal. Neuro: Alert. Normal reflexes,  muscle tone coordination. No cranial nerve deficit. Skin: Skin is warm and dry. No rash noted. Not diaphoretic. No erythema. No pallor.  Psychiatric: Normal mood and affect. Behavior, judgment, thought content normal.   Lab Results  Component Value Date   WBC 9.9 12/19/2012   HGB 12.9* 12/19/2012   HCT 37.5* 12/19/2012   MCV 87.2 12/19/2012   PLT 182 12/19/2012   Lab Results  Component Value Date   CREATININE 0.87 12/19/2012   BUN 19 12/19/2012   NA 139 12/19/2012   K 3.9 12/19/2012   CL 103 12/19/2012   CO2 24 12/19/2012    Lab Results  Component Value Date   HGBA1C 11.9* 12/17/2012   Lipid Panel  No results found for this basename: chol, trig, hdl, cholhdl, vldl, ldlcalc       Assessment and plan:   Patient Active Problem List   Diagnosis Date Noted  . Type I (juvenile type) diabetes mellitus with unspecified complication, uncontrolled - A1c is 11.9 in August 2014 indicating poor glycemic control  - Patient was on NovoLog 70/30 mix 35 units twice daily. Log of CBG's reveals numbers in mostly 200s to 300 range. We will increase this to 45 units twice daily. - Referral to endocrinology provided  01/03/2013    Priority: High  . Essential hypertension, benign 12/17/2012    Priority: Medium - May continue lisinopril 20 mg daily  - We have discussed target BP range - I have advised pt to check BP regularly and to call us back if the numbers are higher than 140/90 - discussed the importance of compliance with medical therapy and diet

## 2013-01-03 NOTE — Patient Instructions (Addendum)
Blood Sugar Monitoring, Adult GLUCOSE METERS FOR SELF-MONITORING OF BLOOD GLUCOSE  It is important to be able to correctly measure your blood sugar (glucose). You can use a blood glucose monitor (a small battery-operated device) to check your glucose level at any time. This allows you and your caregiver to monitor your diabetes and to determine how well your treatment plan is working. The process of monitoring your blood glucose with a glucose meter is called self-monitoring of blood glucose (SMBG). When people with diabetes control their blood sugar, they have better health. To test for glucose with a typical glucose meter, place the disposable strip in the meter. Then place a small sample of blood on the "test strip." The test strip is coated with chemicals that combine with glucose in blood. The meter measures how much glucose is present. The meter displays the glucose level as a number. Several new models can record and store a number of test results. Some models can connect to personal computers to store test results or print them out.  Newer meters are often easier to use than older models. Some meters allow you to get blood from places other than your fingertip. Some new models have automatic timing, error codes, signals, or barcode readers to help with proper adjustment (calibration). Some meters have a large display screen or spoken instructions for people with visual impairments.  INSTRUCTIONS FOR USING GLUCOSE METERS  Wash your hands with soap and warm water, or clean the area with alcohol. Dry your hands completely.  Prick the side of your fingertip with a lancet (a sharp-pointed tool used by hand).  Hold the hand down and gently milk the finger until a small drop of blood appears. Catch the blood with the test strip.  Follow the instructions for inserting the test strip and using the SMBG meter. Most meters require the meter to be turned on and the test strip to be inserted before applying  the blood sample.  Record the test result.  Read the instructions carefully for both the meter and the test strips that go with it. Meter instructions are found in the user manual. Keep this manual to help you solve any problems that may arise. Many meters use "error codes" when there is a problem with the meter, the test strip, or the blood sample on the strip. You will need the manual to understand these error codes and fix the problem.  New devices are available such as laser lancets and meters that can test blood taken from "alternative sites" of the body, other than fingertips. However, you should use standard fingertip testing if your glucose changes rapidly. Also, use standard testing if:  You have eaten, exercised, or taken insulin in the past 2 hours.  You think your glucose is low.  You tend to not feel symptoms of low blood glucose (hypoglycemia).  You are ill or under stress.  Clean the meter as directed by the manufacturer.  Test the meter for accuracy as directed by the manufacturer.  Take your meter with you to your caregiver's office. This way, you can test your glucose in front of your caregiver to make sure you are using the meter correctly. Your caregiver can also take a sample of blood to test using a routine lab method. If values on the glucose meter are close to the lab results, you and your caregiver will see that your meter is working well and you are using good technique. Your caregiver will advise you about what   to do if the results do not match. FREQUENCY OF TESTING  Your caregiver will tell you how often you should check your blood glucose. This will depend on your type of diabetes, your current level of diabetes control, and your types of medicines. The following are general guidelines, but your care plan may be different. Record all your readings and the time of day you took them for review with your caregiver.   Diabetes type 1.  When you are using insulin  with good diabetic control (either multiple daily injections or via a pump), you should check your glucose 4 times a day.  If your diabetes is not well controlled, you may need to monitor more frequently, including before meals and 2 hours after meals, at bedtime, and occasionally between 2 a.m. and 3 a.m.  You should always check your glucose before a dose of insulin or before changing the rate on your insulin pump.  Diabetes type 2.  Guidelines for SMBG in diabetes type 2 are not as well defined.  If you are on insulin, follow the guidelines above.  If you are on medicines, but not insulin, and your glucose is not well controlled, you should test at least twice daily.  If you are not on insulin, and your diabetes is controlled with medicines or diet alone, you should test at least once daily, usually before breakfast.  A weekly profile will help your caregiver advise you on your care plan. The week before your visit, check your glucose before a meal and 2 hours after a meal at least daily. You may want to test before and after a different meal each day so you and your caregiver can tell how well controlled your blood sugars are throughout the course of a 24 hour period.  Gestational diabetes (diabetes during pregnancy).  Frequent testing is often necessary. Accurate timing is important.  If you are not on insulin, check your glucose 4 times a day. Check it before breakfast and 1 hour after the start of each meal.  If you are on insulin, check your glucose 6 times a day. Check it before each meal and 1 hour after the first bite of each meal.  General guidelines.  More frequent testing is required at the start of insulin treatment. Your caregiver will instruct you.  Test your glucose any time you suspect you have low blood sugar (hypoglycemia).  You should test more often when you change medicines, when you have unusual stress or illness, or in other unusual circumstances. OTHER  THINGS TO KNOW ABOUT GLUCOSE METERS  Measurement Range. Most glucose meters are able to read glucose levels over a broad range of values from as low as 0 to as high as 600 mg/dL. If you get an extremely high or low reading from your meter, you should first confirm it with another reading. Report very high or very low readings to your caregiver.  Whole Blood Glucose versus Plasma Glucose. Some older home glucose meters measure glucose in your whole blood. In a lab or when using some newer home glucose meters, the glucose is measured in your plasma (one component of blood). The difference can be important. It is important for you and your caregiver to know whether your meter gives its results as "whole blood equivalent" or "plasma equivalent."  Display of High and Low Glucose Values. Part of learning how to operate a meter is understanding what the meter results mean. Know how high and low glucose concentrations are displayed  on your meter.  Factors that Affect Glucose Meter Performance. The accuracy of your test results depends on many factors and varies depending on the brand and type of meter. These factors include:  Low red blood cell count (anemia).  Substances in your blood (such as uric acid, vitamin C, and others).  Environmental factors (temperature, humidity, altitude).  Name-brand versus generic test strips.  Calibration. Make sure your meter is set up properly. It is a good idea to do a calibration test with a control solution recommended by the manufacturer of your meter whenever you begin using a fresh bottle of test strips. This will help verify the accuracy of your meter.  Improperly stored, expired, or defective test strips. Keep your strips in a dry place with the lid on.  Soiled meter.  Inadequate blood sample. NEW TECHNOLOGIES FOR GLUCOSE TESTING Alternative site testing Some glucose meters allow testing blood from alternative sites. These include the:  Upper  arm.  Forearm.  Base of the thumb.  Thigh. Sampling blood from alternative sites may be desirable. However, it may have some limitations. Blood in the fingertips show changes in glucose levels more quickly than blood in other parts of the body. This means that alternative site test results may be different from fingertip test results, not because of the meter's ability to test accurately, but because the actual glucose concentration can be different.  Continuous Glucose Monitoring Devices to measure your blood glucose continuously are available, and others are in development. These methods can be more expensive than self-monitoring with a glucose meter. However, it is uncertain how effective and reliable these devices are. Your caregiver will advise you if this approach makes sense for you. IF BLOOD SUGARS ARE CONTROLLED, PEOPLE WITH DIABETES REMAIN HEALTHIER.  SMBG is an important part of the treatment plan of patients with diabetes mellitus. Below are reasons for using SMBG:   It confirms that your glucose is at a specific, healthy level.  It detects hypoglycemia and severe hyperglycemia.  It allows you and your caregiver to make adjustments in response to changes in lifestyle for individuals requiring medicine.  It determines the need for starting insulin therapy in temporary diabetes that happens during pregnancy (gestational diabetes). Document Released: 04/23/2003 Document Revised: 07/13/2011 Document Reviewed: 08/14/2010 [facility] Patient Information 2014 [place], [place]. Diabetes and Exercise Regular exercise is important and can help:   Control blood glucose (sugar).  Decrease blood pressure.    Control blood lipids (cholesterol, triglycerides).  Improve overall health. BENEFITS FROM EXERCISE  Improved fitness.  Improved flexibility.  Improved endurance.  Increased bone density.  Weight control.  Increased muscle strength.  Decreased body fat.  Improvement of  the body's use of insulin, a hormone.  Increased insulin sensitivity.  Reduction of insulin needs.  Reduced stress and tension.  Helps you feel better. People with diabetes who add exercise to their lifestyle gain additional benefits, including:  Weight loss.  Reduced appetite.  Improvement of the body's use of blood glucose.  Decreased risk factors for heart disease:  Lowering of cholesterol and triglycerides.  Raising the level of good cholesterol (high-density lipoproteins, HDL).  Lowering blood sugar.  Decreased blood pressure. TYPE 1 DIABETES AND EXERCISE  Exercise will usually lower your blood glucose.  If blood glucose is greater than 240 mg/dl, check urine ketones. If ketones are present, do not exercise.  Location of the insulin injection sites may need to be adjusted with exercise. Avoid injecting insulin into areas of the body that will  be exercised. For example, avoid injecting insulin into:  The arms when playing tennis.  The legs when jogging. For more information, discuss this with your caregiver.  Keep a record of:  Food intake.  Type and amount of exercise.  Expected peak times of insulin action.  Blood glucose levels. Do this before, during, and after exercise. Review your records with your caregiver. This will help you to develop guidelines for adjusting food intake and insulin amounts.  TYPE 2 DIABETES AND EXERCISE  Regular physical activity can help control blood glucose.  Exercise is important because it may:  Increase the body's sensitivity to insulin.  Improve blood glucose control.  Exercise reduces the risk of heart disease. It decreases serum cholesterol and triglycerides. It also lowers blood pressure.  Those who take insulin or oral hypoglycemic agents should watch for signs of hypoglycemia. These signs include dizziness, shaking, sweating, chills, and confusion.  Body water is lost during exercise. It must be replaced. This  will help to avoid loss of body fluids (dehydration) or heat stroke. Be sure to talk to your caregiver before starting an exercise program to make sure it is safe for you. Remember, any activity is better than none.  Document Released: 07/11/2003 Document Revised: 07/13/2011 Document Reviewed: 10/25/2008 [facility] Patient Information 2014 [place], [place]. Diabetes, Frequently Asked Questions WHAT IS DIABETES? Most of the food we eat is turned into glucose (sugar). Our bodies use it for energy. The pancreas makes a hormone called insulin. It helps glucose get into the cells of our bodies. When you have diabetes, your body either does not make enough insulin or cannot use its own insulin as well as it should. This causes sugars to build up in your blood. WHAT ARE THE SYMPTOMS OF DIABETES?  Frequent urination.  Excessive thirst.  Unexplained weight loss.  Extreme hunger.  Blurred vision.  Tingling or numbness in hands or feet.  Feeling very tired much of the time.  Dry, itchy skin.  Sores that are slow to heal.  Yeast infections. WHAT ARE THE TYPES OF DIABETES? Type 1 Diabetes   About 10% of affected people have this type.  Usually occurs before the age of 29.  Usually occurs in thin to normal weight people. Type 2 Diabetes  About 90% of affected people have this type.  Usually occurs after the age of 41.  Usually occurs in overweight people.  More likely to have:  A family history of diabetes.  A history of diabetes during pregnancy (gestational diabetes).  High blood pressure.  High cholesterol and triglycerides. Gestational Diabetes  Occurs in about 4% of pregnancies.  Usually goes away after the baby is born.  More likely to occur in women with:  Family history of diabetes.  Previous gestational diabetes.  Obese.  Over 23 years old. WHAT IS PRE-DIABETES? Pre-diabetes means your blood glucose is higher than normal, but lower than the diabetes range.  It also means you are at risk of getting type 2 diabetes and heart disease. If you are told you have pre-diabetes, have your blood glucose checked again in 1 to 2 years. WHAT IS THE TREATMENT FOR DIABETES? Treatment is aimed at keeping blood glucose near normal levels at all times. Learning how to manage this yourself is important in treating diabetes. Depending on the type of diabetes you have, your treatment will include one or more of the following:  Monitoring your blood glucose.  Meal planning.  Exercise.  Oral medicine (pills) or insulin. CAN DIABETES  BE PREVENTED? With type 1 diabetes, prevention is more difficult, because the triggers that cause it are not yet known. With type 2 diabetes, prevention is more likely, with lifestyle changes:  Maintain a healthy weight.  Eat healthy.  Exercise. IS THERE A CURE FOR DIABETES? No, there is no cure for diabetes. There is a lot of research going on that is looking for a cure, and progress is being made. Diabetes can be treated and controlled. People with diabetes can manage their diabetes and lead normal, active lives. SHOULD I BE TESTED FOR DIABETES? If you are at least 33 years old, you should be tested for diabetes. You should be tested again every 3 years. If you are 45 or older and overweight, you may want to get tested more often. If you are younger than 45, overweight, and have one or more of the following risk factors, you should be tested:  Family history of diabetes.  Inactive lifestyle.  High blood pressure. WHAT ARE SOME OTHER SOURCES FOR INFORMATION ON DIABETES? The following organizations may help in your search for more information on diabetes: National Diabetes Education Program (NDEP) Internet: [employer] American Diabetes Association Internet: http://www.diabetes.org  Juvenile Diabetes Foundation International Internet: [employer] Document Released: 04/23/2003 Document Revised:  07/13/2011 Document Reviewed: 02/15/2009 [facility] Patient Information 2014 [place], [place].

## 2013-01-03 NOTE — Progress Notes (Signed)
Patient recently diagnosed with DM Blood sugar this am was 162 A1C 11.9 two weeks ago

## 2013-01-05 ENCOUNTER — Ambulatory Visit: Payer: Medicaid Other | Attending: Internal Medicine

## 2013-02-02 ENCOUNTER — Encounter: Payer: Self-pay | Admitting: Internal Medicine

## 2013-02-02 ENCOUNTER — Ambulatory Visit: Payer: No Typology Code available for payment source | Attending: Internal Medicine | Admitting: Internal Medicine

## 2013-02-02 VITALS — BP 114/74 | HR 90 | Temp 98.2°F | Resp 16 | Wt 213.0 lb

## 2013-02-02 DIAGNOSIS — E119 Type 2 diabetes mellitus without complications: Secondary | ICD-10-CM

## 2013-02-02 LAB — LIPID PANEL
HDL: 32 mg/dL — ABNORMAL LOW (ref >39)
LDL Cholesterol: 109 mg/dL — ABNORMAL HIGH (ref 0–99)
Total CHOL/HDL Ratio: 5.9 Ratio

## 2013-02-02 LAB — GLUCOSE, POCT (MANUAL RESULT ENTRY): POC Glucose: 169 mg/dl — AB (ref 70–99)

## 2013-02-02 MED ORDER — LISINOPRIL 10 MG PO TABS: 5.0000 mg | ORAL_TABLET | Freq: Every day | ORAL | Status: DC

## 2013-02-02 MED ORDER — LISINOPRIL 10 MG PO TABS: 10.0000 mg | ORAL_TABLET | Freq: Every day | ORAL | Status: DC

## 2013-02-02 NOTE — Progress Notes (Unsigned)
Pt is here for DM f/u  Voices no new concerns He is alert w/no signs of acute distress.

## 2013-02-02 NOTE — Patient Instructions (Signed)
Accuchecks 4 times/day, Once in AM empty stomach and then before each meal. Log in all results and show them to your Prim.MD next visit. If any glucose reading is under 80 or above 300 call your Prim MD immidiately. Follow Low glucose instructions for glucose under 80 as instructed.

## 2013-02-02 NOTE — Progress Notes (Unsigned)
Patient ID: Justin Wilkinson, male   DOB: 1980-08-10, 32 y.o.   MRN: [id number] Patient Demographics  Justin Wilkinson, is a 32 y.o. male  [id number]  [id number]  DOB - 12-12-1980  Chief Complaint  Patient presents with  . Follow-up        Subjective:   Justin Wilkinson with History of DM type I which was diagnosed recently few months ago during hospitalization for DKA is here for glycemic control follow up, he maintains a logbook and is been checking his sugars q. a.c. at bedtime, sugars have been running under 150, he's been compliant with diet exercise. No subjective complaints.  Denies any subjective complaints except as above, no active headache, no chest abdominal pain at this time, not short of breath. No focal weakness which is new.    Objective:    Patient Active Problem List   Diagnosis Date Noted  . Type I (juvenile type) diabetes mellitus with unspecified complication, uncontrolled 01/03/2013  . Essential hypertension, benign 12/17/2012  . DKA (diabetic ketoacidoses) 12/15/2012     Filed Vitals:   02/02/13 1032  BP: 114/74  Pulse: 90  Temp: 98.2 F (36.8 C)  TempSrc: Oral  Resp: 16  Weight: 213 lb (96.616 kg)  SpO2: 99%     Exam   Awake Alert, Oriented X 3, No new F.N deficits, Normal affect Fraser.AT,PERRAL Supple Neck,No JVD, No cervical lymphadenopathy appriciated.  Symmetrical Chest wall movement, Good air movement bilaterally, CTAB RRR,No Gallops,Rubs or new Murmurs, No Parasternal Heave +ve B.Sounds, Abd Soft, Non tender, No organomegaly appriciated, No rebound - guarding or rigidity. No Cyanosis, Clubbing or edema, No new Rash or bruise      Data Review   CBC No results found for this basename: WBC, HGB, HCT, PLT, MCV, MCH, MCHC, RDW, NEUTRABS, LYMPHSABS, MONOABS, EOSABS, BASOSABS, BANDABS, BANDSABD,  in the last 168 hours  Chemistries   No results found for this basename: NA, K, CL, CO2, GLUCOSE, BUN, CREATININE, GFRCGP, CALCIUM, MG, AST,  ALT, ALKPHOS, BILITOT,  in the last 168 hours ------------------------------------------------------------------------------------------------------------------ No results found for this basename: HGBA1C,  in the last 72 hours ------------------------------------------------------------------------------------------------------------------ No results found for this basename: CHOL, HDL, LDLCALC, TRIG, CHOLHDL, LDLDIRECT,  in the last 72 hours ------------------------------------------------------------------------------------------------------------------ No results found for this basename: TSH, T4TOTAL, FREET3, T3FREE, THYROIDAB,  in the last 72 hours ------------------------------------------------------------------------------------------------------------------ No results found for this basename: VITAMINB12, FOLATE, FERRITIN, TIBC, IRON, RETICCTPCT,  in the last 72 hours  Coagulation profile  No results found for this basename: INR, PROTIME,  in the last 168 hours     Prior to Admission medications   Medication Sig Start Date End Date Taking? Authorizing Provider  Fingerstix Lancets MISC 1 Container by Does not apply route 2 (two) times daily. 01/03/13  Yes Alison Murray, MD  glucose blood (RELION GLUCOSE TEST STRIPS) test strip Use as instructed 01/03/13  Yes Alison Murray, MD  glucose monitoring kit (FREESTYLE) monitoring kit 1 each by Does not apply route as needed for other. May substitute cheapest option.  Please provide diabetic testing supplies, lancets. 12/20/12  Yes Maryruth Bun Rama, MD  insulin NPH-regular (NOVOLIN 70/30) (70-30) 100 UNIT/ML injection Inject 45 Units into the skin 2 (two) times daily with a meal. 01/03/13  Yes Alison Murray, MD  lisinopril (PRINIVIL,ZESTRIL) 5 MG tablet Take 1 tablet (10 mg total) by mouth daily. 02/02/13  Yes Leroy Sea, MD  Alcohol Swabs PADS 1 Container by Does not apply route 2 (two)  times daily. 01/03/13   Alison Murray, MD  Multiple Vitamin  (MULTIVITAMIN WITH MINERALS) TABS tablet Take 1 tablet by mouth daily.    Historical Provider, MD  pantoprazole (PROTONIX) 40 MG tablet Take 1 tablet (40 mg total) by mouth daily. 01/03/13   Alison Murray, MD     Assessment & Plan    Diabetes mellitus type 1 diagnosed a few months ago. He has been taking his above prescribed insulin religiously with excellent glycemic control, logbook shows his q. a.c. at bedtime sugars are below 150. He will require another A1c checked in 2 months. Encouraged on diet exercise and to do Accu-Cheks q. a.c. at bedtime and maintain the logbook. Ophthalmology referral made.    Lab Results  Component Value Date   HGBA1C 11.9* 12/17/2012    Hypertension. His BP running between 1:30 and 1:15, Low-dose lisinopril started at 5 mg. Repeat BMP next visit.   Routine health maintenance.  Flu tetanus shot given.  Ophthalmology referral made

## 2013-02-03 NOTE — Progress Notes (Signed)
Quick Note:  Please call in Lipitor 10 mg daily one month supply 2 refills, let patient know his cholesterol is high needs a repeat cholesterol in 2 months time ______

## 2013-02-06 ENCOUNTER — Encounter: Payer: Self-pay | Admitting: *Deleted

## 2013-02-06 ENCOUNTER — Telehealth: Payer: Self-pay | Admitting: Emergency Medicine

## 2013-02-06 ENCOUNTER — Encounter: Payer: No Typology Code available for payment source | Attending: Pulmonary Disease | Admitting: *Deleted

## 2013-02-06 VITALS — Ht 68.5 in | Wt 215.2 lb

## 2013-02-06 DIAGNOSIS — IMO0002 Reserved for concepts with insufficient information to code with codable children: Secondary | ICD-10-CM | POA: Insufficient documentation

## 2013-02-06 DIAGNOSIS — Z713 Dietary counseling and surveillance: Secondary | ICD-10-CM | POA: Insufficient documentation

## 2013-02-06 DIAGNOSIS — E1065 Type 1 diabetes mellitus with hyperglycemia: Secondary | ICD-10-CM

## 2013-02-06 MED ORDER — ATORVASTATIN CALCIUM 10 MG PO TABS: 10.0000 mg | ORAL_TABLET | Freq: Every day | ORAL | Status: DC

## 2013-02-06 NOTE — Progress Notes (Signed)
Appt start time: 1130 end time:  1300.   Assessment:  Patient was seen on  02/06/13 for individual diabetes education. Mac was admitted to [facility] 3 weeks ago for DKA.  His blood glucose was in the 1600s.  Keigen had lost about 50 pounds suddenly prior to admission.  He was complaining of tiredness, excessive thirst and urination.  He was diagnosed with diabetes while in the hospital and discharged on insulin.  He has been seen at [facility] twice since discharge.  His blood glucose is vastly improved and it ranges from 74-160.  He has made several lifestyle changes since discharge: Used to eat 1 large meal at night.  Used to be a smoker, but quite cold [place] when diagnosed.  Used to drink sweet tea and sprite.  Now only sugar-free beverages.  He lives with his girlfriend, [name], and their 2 boys.  He was not aware whether he had type 1 or type 2 diabetes.   Current HbA1c: 11.9%  MEDICATIONS: see list.  Novalin 70/30 BID   DIETARY INTAKE:  Usual eating pattern includes 3 meals and 2-3 snacks per day.  Everyday foods include proteins, starches, vegetables.  Avoided foods include pork, seafood.    24-hr recall:  B ( AM): cheese omlete with bacon  Snk ( AM): cheese crackers  L ( PM): chicken sandwich or cheese pizza Snk ( PM): maybe crackers D ( PM): baked chicken; spaghetti; steak; burgers.  Vegetables most nights Snk ( PM): wheat crackers Beverages: crystal light, water, diet soda sometimes  Usual physical activity: walks some- maybe 3 days a week  Estimated energy needs: 2000 calories 225 g carbohydrates 150 g protein 56 g fat  Progress Towards Goal(s):  In progress.   Nutritional Diagnosis:  [place]-2.1 Inpaired nutrition utilization As related to carbohydrates.  As evidenced by new diagnosis of type 1 diabetes.    Intervention:  Nutrition counseling provided.  Discussed diabetes disease process and treatment options.  Discussed  physiology of diabetes.  Discussed role of medications and diet in glucose control  Provided education on macronutrients on glucose levels.  Provided education on carb counting, importance of regularly scheduled meals/snacks, and meal planning  Reviewed patient medications.  Discussed role of medication on blood glucose and possible side effects  Discussed blood glucose monitoring and interpretation.  Discussed recommended target ranges and individual ranges.    Described short-term complications: hyper- and hypo-glycemia.  Discussed causes,symptoms, and treatment options.  Discussed the role of prolonged elevated glucose levels on body systems.  Discussed recommendations for long-term diabetes self-care.    Handouts given during visit include: Living Well with Diabetes Meal Plan Card   Barriers to learning/adherance to lifestyle change: none indicated  Diabetes self-care support plan:   [facility] support group  girlfriend  Monitoring/Evaluation:  Dietary intake, exercise, BGM, and body weight prn.  Patient indicated that he would call if he needed additional education

## 2013-02-06 NOTE — Telephone Encounter (Signed)
Message copied by Darlis Loan on Mon Feb 06, 2013 10:41 AM ------      Message from: [facility]      Created: Fri Feb 03, 2013  9:02 AM       Please call in Lipitor 10 mg daily one month supply 2 refills, let patient know his cholesterol is high needs a repeat cholesterol in 2 months time ------

## 2013-02-06 NOTE — Telephone Encounter (Signed)
Left message with lab results.will call medication in to pharmacy

## 2013-02-08 ENCOUNTER — Ambulatory Visit: Payer: No Typology Code available for payment source | Admitting: Endocrinology

## 2013-02-22 ENCOUNTER — Ambulatory Visit: Payer: No Typology Code available for payment source | Admitting: Endocrinology

## 2013-03-09 ENCOUNTER — Other Ambulatory Visit: Payer: Self-pay

## 2013-03-13 ENCOUNTER — Ambulatory Visit: Payer: No Typology Code available for payment source | Admitting: Endocrinology

## 2013-03-14 ENCOUNTER — Ambulatory Visit: Payer: Medicaid Other | Admitting: Endocrinology

## 2013-03-24 ENCOUNTER — Ambulatory Visit (INDEPENDENT_AMBULATORY_CARE_PROVIDER_SITE_OTHER): Payer: No Typology Code available for payment source | Admitting: Endocrinology

## 2013-03-24 ENCOUNTER — Other Ambulatory Visit: Payer: Self-pay | Admitting: *Deleted

## 2013-03-24 ENCOUNTER — Encounter: Payer: Self-pay | Admitting: Endocrinology

## 2013-03-24 VITALS — BP 128/88 | HR 100 | Temp 98.2°F | Resp 16 | Ht 69.0 in | Wt 226.4 lb

## 2013-03-24 DIAGNOSIS — E1065 Type 1 diabetes mellitus with hyperglycemia: Secondary | ICD-10-CM

## 2013-03-24 DIAGNOSIS — Z23 Encounter for immunization: Secondary | ICD-10-CM

## 2013-03-24 DIAGNOSIS — IMO0002 Reserved for concepts with insufficient information to code with codable children: Secondary | ICD-10-CM

## 2013-03-24 LAB — BASIC METABOLIC PANEL
BUN: 14 mg/dL (ref 6–23)
Creatinine, Ser: 1.1 mg/dL (ref 0.4–1.5)
GFR: 102.39 mL/min (ref >60.00)

## 2013-03-24 LAB — MICROALBUMIN / CREATININE URINE RATIO
Creatinine,U: 199 mg/dL
Microalb, Ur: 1.1 mg/dL (ref 0.0–1.9)

## 2013-03-24 LAB — HEMOGLOBIN A1C: Hgb A1c MFr Bld: 7.2 % — ABNORMAL HIGH (ref 4.6–6.5)

## 2013-03-24 MED ORDER — GLUCOSE BLOOD VI STRP: 1.0000 | ORAL_STRIP | Freq: Four times a day (QID) | Status: DC

## 2013-03-24 MED ORDER — GLUCOSE BLOOD VI STRP: ORAL_STRIP | Status: DC

## 2013-03-24 MED ORDER — ACCU-CHEK NANO SMARTVIEW W/DEVICE KIT: PACK | Status: DC

## 2013-03-24 NOTE — Progress Notes (Signed)
Subjective:    Patient ID: Justin Wilkinson, male    DOB: 1981-03-05, 32 y.o.   MRN: [id number]  HPI In august of 2014, pt was admitted with dka.  He had no previous h/o DM.  He has been on insulin ever since then.  He has little if any numbness of the feet, or blurry vision.  He says cbg's are well-controlled.   Past Medical History  Diagnosis Date  . Diabetes mellitus without complication     No past surgical history on file.  History   Social History  . Marital Status: Single    Spouse Name: N/A    Number of Children: N/A  . Years of Education: N/A   Occupational History  . Not on file.   Social History Main Topics  . Smoking status: Former Smoker -- 0.50 packs/day    Types: Cigarettes    Quit date: 12/15/2012  . Smokeless tobacco: Never Used  . Alcohol Use: No  . Drug Use: No  . Sexual Activity: No   Other Topics Concern  . Not on file   Social History Narrative  . No narrative on file    Current Outpatient Prescriptions on File Prior to Visit  Medication Sig Dispense Refill  . Alcohol Swabs PADS 1 Container by Does not apply route 2 (two) times daily.  100 each  12  . atorvastatin (LIPITOR) 10 MG tablet Take 1 tablet (10 mg total) by mouth daily.  30 tablet  2  . Fingerstix Lancets MISC 1 Container by Does not apply route 2 (two) times daily.  200 each  12  . glucose blood (RELION GLUCOSE TEST STRIPS) test strip Use as instructed  100 each  12  . glucose monitoring kit (FREESTYLE) monitoring kit 1 each by Does not apply route as needed for other. May substitute cheapest option.  Please provide diabetic testing supplies, lancets.  1 each  12  . insulin NPH-regular (NOVOLIN 70/30) (70-30) 100 UNIT/ML injection Inject 45 Units into the skin 2 (two) times daily with a meal.  10 mL  12  . lisinopril (PRINIVIL,ZESTRIL) 10 MG tablet Take 0.5 tablets (5 mg total) by mouth daily.  30 tablet  1  . Multiple Vitamin (MULTIVITAMIN WITH MINERALS) TABS tablet Take 1 tablet by  mouth daily.      . pantoprazole (PROTONIX) 40 MG tablet Take 1 tablet (40 mg total) by mouth daily.  30 tablet  3   No current facility-administered medications on file prior to visit.   Allergies  Allergen Reactions  . Fish Allergy Anaphylaxis  . Shellfish Allergy Anaphylaxis   Family History  Problem Relation Age of Onset  . Lung cancer Father   . Diabetes Other    BP 128/88  Pulse 100  Temp(Src) 98.2 F (36.8 C) (Oral)  Resp 16  Ht 5\' 9"  (1.753 m)  Wt 226 lb 6.4 oz (102.694 kg)  BMI 33.42 kg/m2 He missed his first 4 appointments here, and is late for this one.    Review of Systems He has regained some of the weight he had lost.  denies fever, blurry vision, headache, chest pain, sob, n/v, urinary frequency, cramps, excessive diaphoresis, memory loss, depression, hypoglycemia, rhinorrhea, and easy bruising.      Objective:   Physical Exam VS: see vs page GEN: no distress HEAD: head: no deformity eyes: no periorbital swelling, no proptosis external nose and ears are normal mouth: no lesion seen NECK: supple, thyroid is not enlarged CHEST WALL:  no deformity LUNGS: clear to auscultation BREASTS:  No gynecomastia CV: reg rate and rhythm, no murmur ABD: abdomen is soft, nontender.  no hepatosplenomegaly.  not distended.  no hernia MUSCULOSKELETAL: muscle bulk and strength are grossly normal.  no obvious joint swelling.  gait is normal and steady PULSES:  no carotid bruit NEURO:  cn 2-12 grossly intact.   readily moves all 4's.  sensation is intact to touch on the feet SKIN:  Normal texture and temperature.  No rash or suspicious lesion is visible.   NODES:  None palpable at the neck PSYCH: alert, oriented x3.  Does not appear anxious nor depressed.  Lab Results  Component Value Date   HGBA1C 7.2* 03/24/2013      Assessment & Plan:  Type 1 DM: this is the best control this pt should aim for, given this regimen, which does match insulin to her changing needs  throughout the day.  This insulin regimen was chosen from multiple options, for its simplicity.  The benefits of glycemic control must be weighed against the risks of hypoglycemia.   HTN: borderline control.  We'll continue the same rx for now. Weight loss, due to severe hyperglycemia: improved.

## 2013-03-24 NOTE — Patient Instructions (Addendum)
good diet and exercise habits significanly improve the control of your diabetes.  please let me know if you wish to be referred to a dietician.  high blood sugar is very risky to your health.  you should see an eye doctor every year.  You are at higher than average risk for pneumonia and hepatitis-B.  You should be vaccinated against both.   controlling your blood pressure and cholesterol drastically reduces the damage diabetes does to your body.  this also applies to quitting smoking.  please discuss these with your doctor.  check your blood sugar 4 times a day: before the 3 meals, and at bedtime.  also check if you have symptoms of your blood sugar being too high or too low.  please keep a record of the readings and bring it to your next appointment here.  You can write it on any piece of paper.  please call us sooner if your blood sugar goes below 70, or if you have a lot of readings over 200. blood tests are being requested for you today.  We'll contact you with results.   Please come back for a follow-up appointment in 3 months.   

## 2013-04-03 ENCOUNTER — Encounter: Payer: Self-pay | Admitting: Internal Medicine

## 2013-04-03 ENCOUNTER — Ambulatory Visit: Payer: No Typology Code available for payment source | Attending: Internal Medicine | Admitting: Internal Medicine

## 2013-04-03 VITALS — BP 159/80 | HR 95 | Temp 98.4°F | Resp 16 | Ht 69.0 in | Wt 223.0 lb

## 2013-04-03 DIAGNOSIS — E785 Hyperlipidemia, unspecified: Secondary | ICD-10-CM | POA: Insufficient documentation

## 2013-04-03 DIAGNOSIS — E119 Type 2 diabetes mellitus without complications: Secondary | ICD-10-CM

## 2013-04-03 DIAGNOSIS — I10 Essential (primary) hypertension: Secondary | ICD-10-CM | POA: Insufficient documentation

## 2013-04-03 DIAGNOSIS — IMO0002 Reserved for concepts with insufficient information to code with codable children: Secondary | ICD-10-CM

## 2013-04-03 DIAGNOSIS — IMO0001 Reserved for inherently not codable concepts without codable children: Secondary | ICD-10-CM | POA: Insufficient documentation

## 2013-04-03 DIAGNOSIS — E1065 Type 1 diabetes mellitus with hyperglycemia: Secondary | ICD-10-CM

## 2013-04-03 MED ORDER — LISINOPRIL 10 MG PO TABS: 10.0000 mg | ORAL_TABLET | Freq: Every day | ORAL | Status: DC

## 2013-04-03 MED ORDER — INSULIN NPH ISOPHANE & REGULAR (70-30) 100 UNIT/ML ~~LOC~~ SUSP: 45.0000 [IU] | Freq: Two times a day (BID) | SUBCUTANEOUS | Status: DC

## 2013-04-03 MED ORDER — ATORVASTATIN CALCIUM 10 MG PO TABS: 10.0000 mg | ORAL_TABLET | Freq: Every day | ORAL | Status: DC

## 2013-04-03 NOTE — Progress Notes (Signed)
Pt is here following up on his diabetes and HTN. 

## 2013-04-03 NOTE — Patient Instructions (Signed)
DASH Diet The DASH diet stands for "Dietary Approaches to Stop Hypertension." It is a healthy eating plan that has been shown to reduce high blood pressure (hypertension) in as little as 14 days, while also possibly providing other significant health benefits. These other health benefits include reducing the risk of breast cancer after menopause and reducing the risk of type 2 diabetes, heart disease, colon cancer, and stroke. Health benefits also include weight loss and slowing kidney failure in patients with chronic kidney disease.  DIET GUIDELINES  Limit salt (sodium). Your diet should contain less than 1500 mg of sodium daily.  Limit refined or processed carbohydrates. Your diet should include mostly whole grains. Desserts and added sugars should be used sparingly.  Include small amounts of heart-healthy fats. These types of fats include nuts, oils, and tub margarine. Limit saturated and trans fats. These fats have been shown to be harmful in the body. CHOOSING FOODS  The following food groups are based on a 2000 calorie diet. See your Registered Dietitian for individual calorie needs. Grains and Grain Products (6 to 8 servings daily)  Eat More Often: Whole-wheat bread, brown rice, whole-grain or wheat pasta, quinoa, popcorn without added fat or salt (air popped).  Eat Less Often: White bread, white pasta, white rice, cornbread. Vegetables (4 to 5 servings daily)  Eat More Often: Fresh, frozen, and canned vegetables. Vegetables may be raw, steamed, roasted, or grilled with a minimal amount of fat.  Eat Less Often/Avoid: Creamed or fried vegetables. Vegetables in a cheese sauce. Fruit (4 to 5 servings daily)  Eat More Often: All fresh, canned (in natural juice), or frozen fruits. Dried fruits without added sugar. One hundred percent fruit juice ( cup [237 mL] daily).  Eat Less Often: Dried fruits with added sugar. Canned fruit in light or heavy syrup. Lean Meats, Fish, and Poultry (2  servings or less daily. One serving is 3 to 4 oz [85-114 g]).  Eat More Often: Ninety percent or leaner ground beef, tenderloin, sirloin. Round cuts of beef, chicken breast, turkey breast. All fish. Grill, bake, or broil your meat. Nothing should be fried.  Eat Less Often/Avoid: Fatty cuts of meat, turkey, or chicken leg, thigh, or wing. Fried cuts of meat or fish. Dairy (2 to 3 servings)  Eat More Often: Low-fat or fat-free milk, low-fat plain or light yogurt, reduced-fat or part-skim cheese.  Eat Less Often/Avoid: Milk (whole, 2%).Whole milk yogurt. Full-fat cheeses. Nuts, Seeds, and Legumes (4 to 5 servings per week)  Eat More Often: All without added salt.  Eat Less Often/Avoid: Salted nuts and seeds, canned beans with added salt. Fats and Sweets (limited)  Eat More Often: Vegetable oils, tub margarines without trans fats, sugar-free gelatin. Mayonnaise and salad dressings.  Eat Less Often/Avoid: Coconut oils, palm oils, butter, stick margarine, cream, half and half, cookies, candy, pie. FOR MORE INFORMATION The Dash Diet Eating Plan: www.dashdiet.org Document Released: 04/09/2011 Document Revised: 07/13/2011 Document Reviewed: 04/09/2011 ExitCare Patient Information 2014 [employer]. Diabetes and Exercise Exercising regularly is important. It is not just about losing weight. It has many health benefits, such as:  Improving your overall fitness, flexibility, and endurance.  Increasing your bone density.  Helping with weight control.  Decreasing your body fat.  Increasing your muscle strength.  Reducing stress and tension.  Improving your overall health. People with diabetes who exercise gain additional benefits because exercise:  Reduces appetite.  Improves the body's use of blood sugar (glucose).  Helps lower or control blood glucose.    Decreases blood pressure.  Helps control blood lipids (such as cholesterol and triglycerides).  Improves the body's use  of the hormone insulin by:  Increasing the body's insulin sensitivity.  Reducing the body's insulin needs.  Decreases the risk for heart disease because exercising:  Lowers cholesterol and triglycerides levels.  Increases the levels of good cholesterol (such as high-density lipoproteins [HDL]) in the body.  Lowers blood glucose levels. YOUR ACTIVITY PLAN  Choose an activity that you enjoy and set realistic goals. Your health care provider or diabetes educator can help you make an activity plan that works for you. You can break activities into 2 or 3 sessions throughout the day. Doing so is as good as one long session. Exercise ideas include:  Taking the dog for a walk.  Taking the stairs instead of the elevator.  Dancing to your favorite song.  Doing your favorite exercise with a friend. RECOMMENDATIONS FOR EXERCISING WITH TYPE 1 OR TYPE 2 DIABETES   Check your blood glucose before exercising. If blood glucose levels are greater than 240 mg/dL, check for urine ketones. Do not exercise if ketones are present.  Avoid injecting insulin into areas of the body that are going to be exercised. For example, avoid injecting insulin into:  The arms when playing tennis.  The legs when jogging.  Keep a record of:  Food intake before and after you exercise.  Expected peak times of insulin action.  Blood glucose levels before and after you exercise.  The type and amount of exercise you have done.  Review your records with your health care provider. Your health care provider will help you to develop guidelines for adjusting food intake and insulin amounts before and after exercising.  If you take insulin or oral hypoglycemic agents, watch for signs and symptoms of hypoglycemia. They include:  Dizziness.  Shaking.  Sweating.  Chills.  Confusion.  Drink plenty of water while you exercise to prevent dehydration or heat stroke. Body water is lost during exercise and must be  replaced.  Talk to your health care provider before starting an exercise program to make sure it is safe for you. Remember, almost any type of activity is better than none. Document Released: 07/11/2003 Document Revised: 12/21/2012 Document Reviewed: 09/27/2012 ExitCare Patient Information 2014 [employer].  

## 2013-04-03 NOTE — Progress Notes (Signed)
Patient ID: Justin Wilkinson, male   DOB: 1980-09-09, 32 y.o.   MRN: [id number] Patient Demographics  Justin Wilkinson, is a 32 y.o. male  YQM:[id number]  BMW:[id number]  DOB - 29-Sep-1980  Chief Complaint  Patient presents with  . Follow-up  . Diabetes        Subjective:   Justin Wilkinson is a 32 y.o. male here today for a follow up visit. Patient is here for refill of medications. Blood sugar ranges between 125 and 150 with the current regimen. No complications, no hypoglycemic episode. Patient is compliant with medication, has good family support. He quit smoking a few months ago. He does not drink alcohol. Patient has No headache, No chest pain, No abdominal pain - No Nausea, No new weakness tingling or numbness, No Cough - SOB.  ALLERGIES: Allergies  Allergen Reactions  . Fish Allergy Anaphylaxis  . Shellfish Allergy Anaphylaxis    PAST MEDICAL HISTORY: Past Medical History  Diagnosis Date  . Diabetes mellitus without complication     MEDICATIONS AT HOME: Prior to Admission medications   Medication Sig Start Date End Date Taking? Authorizing Provider  Alcohol Swabs PADS 1 Container by Does not apply route 2 (two) times daily. 01/03/13  Yes Alison Murray, MD  atorvastatin (LIPITOR) 10 MG tablet Take 1 tablet (10 mg total) by mouth daily. 04/03/13  Yes Jeanann Lewandowsky, MD  Blood Glucose Monitoring Suppl (ACCU-CHEK NANO SMARTVIEW) W/DEVICE KIT Check blood sugars 4 times/day 03/24/13  Yes Romero Belling, MD  Fingerstix Lancets MISC 1 Container by Does not apply route 2 (two) times daily. 01/03/13  Yes Alison Murray, MD  glucose blood test strip Use as instructed  Check blood sugar 4 times/day 03/24/13  Yes Romero Belling, MD  insulin NPH-regular (NOVOLIN 70/30) (70-30) 100 UNIT/ML injection Inject 45 Units into the skin 2 (two) times daily with a meal. 04/03/13  Yes Jeanann Lewandowsky, MD  lisinopril (PRINIVIL,ZESTRIL) 10 MG tablet Take 1 tablet (10 mg total) by mouth daily. 04/03/13  Yes  Jeanann Lewandowsky, MD  Multiple Vitamin (MULTIVITAMIN WITH MINERALS) TABS tablet Take 1 tablet by mouth daily.    Historical Provider, MD  pantoprazole (PROTONIX) 40 MG tablet Take 1 tablet (40 mg total) by mouth daily. 01/03/13   Alison Murray, MD     Objective:   Filed Vitals:   04/03/13 1041  BP: 159/80  Pulse: 95  Temp: 98.4 F (36.9 C)  TempSrc: Oral  Resp: 16  Height: 5\' 9"  (1.753 m)  Weight: 223 lb (101.152 kg)  SpO2: 94%    Exam General appearance : Awake, alert, not in any distress. Speech Clear. Not toxic looking HEENT: Atraumatic and Normocephalic, pupils equally reactive to light and accomodation Neck: supple, no JVD. No cervical lymphadenopathy.  Chest:Good air entry bilaterally, no added sounds  CVS: S1 S2 regular, no murmurs.  Abdomen: Bowel sounds present, Non tender and not distended with no gaurding, rigidity or rebound. Extremities: B/L Lower Ext shows no edema, both legs are warm to touch Neurology: Awake alert, and oriented X 3, CN II-XII intact, Non focal Skin:No Rash Wounds:N/A   Data Review   CBC No results found for this basename: WBC, HGB, HCT, PLT, MCV, MCH, MCHC, RDW, NEUTRABS, LYMPHSABS, MONOABS, EOSABS, BASOSABS, BANDABS, BANDSABD,  in the last 168 hours  Chemistries   No results found for this basename: NA, K, CL, CO2, GLUCOSE, BUN, CREATININE, GFRCGP, CALCIUM, MG, AST, ALT, ALKPHOS, BILITOT,  in the last 168 hours ------------------------------------------------------------------------------------------------------------------ No results  found for this basename: HGBA1C,  in the last 72 hours ------------------------------------------------------------------------------------------------------------------ No results found for this basename: CHOL, HDL, LDLCALC, TRIG, CHOLHDL, LDLDIRECT,  in the last 72 hours ------------------------------------------------------------------------------------------------------------------ No results found for  this basename: TSH, T4TOTAL, FREET3, T3FREE, THYROIDAB,  in the last 72 hours ------------------------------------------------------------------------------------------------------------------ No results found for this basename: VITAMINB12, FOLATE, FERRITIN, TIBC, IRON, RETICCTPCT,  in the last 72 hours  Coagulation profile  No results found for this basename: INR, PROTIME,  in the last 168 hours    Assessment & Plan   1. Diabetes  - Glucose (CBG) Patient counseled extensively about nutrition and exercise  2. Type I (juvenile type) diabetes mellitus with unspecified complication, uncontrolled Continue - insulin NPH-regular (NOVOLIN 70/30) (70-30) 100 UNIT/ML injection; Inject 45 Units into the skin 2 (two) times daily with a meal.  Dispense: 30 mL; Refill: 12  3. Essential hypertension, benign Add - lisinopril (PRINIVIL,ZESTRIL) 10 MG tablet; Take 1 tablet (10 mg total) by mouth daily.  Dispense: 90 tablet; Refill: 3  4. Dyslipidemia Continue - atorvastatin (LIPITOR) 10 MG tablet; Take 1 tablet (10 mg total) by mouth daily.  Dispense: 90 tablet; Refill: 3   Follow up in 3 months or when necessary  The patient was given clear instructions to go to ER or return to medical center if symptoms don't improve, worsen or new problems develop. The patient verbalized understanding. The patient was told to call to get lab results if they haven't heard anything in the next week.    Jeanann Lewandowsky, MD, MHA, FACP, FAAP [facility] [place], [place] [phone]   04/03/2013, 11:16 AM

## 2013-06-08 ENCOUNTER — Ambulatory Visit (INDEPENDENT_AMBULATORY_CARE_PROVIDER_SITE_OTHER): Payer: Medicaid Other | Admitting: Endocrinology

## 2013-06-08 ENCOUNTER — Encounter: Payer: Self-pay | Admitting: Endocrinology

## 2013-06-08 VITALS — BP 116/80 | HR 103 | Temp 97.3°F | Ht 69.0 in | Wt 230.0 lb

## 2013-06-08 DIAGNOSIS — IMO0002 Reserved for concepts with insufficient information to code with codable children: Secondary | ICD-10-CM

## 2013-06-08 DIAGNOSIS — E108 Type 1 diabetes mellitus with unspecified complications: Principal | ICD-10-CM

## 2013-06-08 DIAGNOSIS — E1065 Type 1 diabetes mellitus with hyperglycemia: Secondary | ICD-10-CM

## 2013-06-08 LAB — HEMOGLOBIN A1C: HEMOGLOBIN A1C: 7.5 % — AB (ref 4.6–6.5)

## 2013-06-08 NOTE — Progress Notes (Signed)
Subjective:    Patient ID: Justin Wilkinson, male    DOB: [birth year], 33 y.o.   MRN: [id number]  HPI Pt returns for f/u of type 1 DM (dx;ed august of 2014, pt was admitted with dka; he had no previous h/o DM; he has been on insulin ever since then; he has mild if any neuropathy of the lower extremities; no assoc complications; he is willing to take insulin only as often as qd).  no cbg record, but states cbg's are well-controlled.  There is no trend throughout the day.  Pt says he did not have any insulin over the thanksgiving weekend, and cbg's were "not very high."  Pt says he sees another provider, who may soon recommend changing to oral agents.   Past Medical History  Diagnosis Date  . Diabetes mellitus without complication     No past surgical history on file.  History   Social History  . Marital Status: Single    Spouse Name: N/A    Number of Children: N/A  . Years of Education: N/A   Occupational History  . Not on file.   Social History Main Topics  . Smoking status: Former Smoker -- 0.50 packs/day    Types: Cigarettes    Quit date: 12/15/2012  . Smokeless tobacco: Never Used  . Alcohol Use: No  . Drug Use: No  . Sexual Activity: No   Other Topics Concern  . Not on file   Social History Narrative  . No narrative on file    Current Outpatient Prescriptions on File Prior to Visit  Medication Sig Dispense Refill  . Alcohol Swabs PADS 1 Container by Does not apply route 2 (two) times daily.  100 each  12  . atorvastatin (LIPITOR) 10 MG tablet Take 1 tablet (10 mg total) by mouth daily.  90 tablet  3  . Blood Glucose Monitoring Suppl (ACCU-CHEK NANO SMARTVIEW) W/DEVICE KIT Check blood sugars 4 times/day  1 kit  0  . Fingerstix Lancets MISC 1 Container by Does not apply route 2 (two) times daily.  200 each  12  . glucose blood test strip Use as instructed  Check blood sugar 4 times/day  100 each  12  . insulin NPH-regular (NOVOLIN 70/30) (70-30) 100 UNIT/ML injection  Inject 45 Units into the skin 2 (two) times daily with a meal.  30 mL  12  . lisinopril (PRINIVIL,ZESTRIL) 10 MG tablet Take 1 tablet (10 mg total) by mouth daily.  90 tablet  3  . Multiple Vitamin (MULTIVITAMIN WITH MINERALS) TABS tablet Take 1 tablet by mouth daily.      . pantoprazole (PROTONIX) 40 MG tablet Take 1 tablet (40 mg total) by mouth daily.  30 tablet  3   No current facility-administered medications on file prior to visit.    Allergies  Allergen Reactions  . Fish Allergy Anaphylaxis  . Shellfish Allergy Anaphylaxis    Family History  Problem Relation Age of Onset  . Lung cancer Father   . Diabetes Other     BP 116/80  Pulse 103  Temp(Src) 97.3 F (36.3 C) (Oral)  Ht 5' 9"  (1.753 m)  Wt 230 lb (104.327 kg)  BMI 33.95 kg/m2  SpO2 93%  Review of Systems denies hypoglycemia.  He has gained weight.      Objective:   Physical Exam VITAL SIGNS:  See vs page GENERAL: no distress  Lab Results  Component Value Date   HGBA1C 7.5* 06/08/2013  Assessment & Plan:  Type 1 DM: this is the best control this pt should aim for, given this regimen, which does match insulin to her changing needs throughout the day.  This insulin regimen was chosen from multiple options, for its simplicity.  The benefits of glycemic control must be weighed against the risks of hypoglycemia.  Weight gain, due to recovery from DKA, and improved control.

## 2013-06-08 NOTE — Patient Instructions (Addendum)
check your blood sugar 4 times a day: before the 3 meals, and at bedtime.  also check if you have symptoms of your blood sugar being too high or too low.  please keep a record of the readings and bring it to your next appointment here.  You can write it on any piece of paper.  please call us sooner if your blood sugar goes below 70, or if you have a lot of readings over 200.   A diabetes blood test is requested for you today.  We'll contact you with results.   Please come back for a follow-up appointment in 3 months.   It is possible that you cold go through a phase in which you need less insulin.  However, you should not go off the insulin altogether unless we decide this together.

## 2013-07-10 ENCOUNTER — Ambulatory Visit: Payer: Medicaid Other | Attending: Internal Medicine | Admitting: Internal Medicine

## 2013-07-10 ENCOUNTER — Encounter: Payer: Self-pay | Admitting: Internal Medicine

## 2013-07-10 VITALS — BP 130/76 | HR 76 | Temp 98.4°F | Resp 16

## 2013-07-10 DIAGNOSIS — E785 Hyperlipidemia, unspecified: Secondary | ICD-10-CM

## 2013-07-10 DIAGNOSIS — E119 Type 2 diabetes mellitus without complications: Secondary | ICD-10-CM

## 2013-07-10 DIAGNOSIS — I10 Essential (primary) hypertension: Secondary | ICD-10-CM

## 2013-07-10 NOTE — Progress Notes (Signed)
MRN: [id number] Name: Justin Wilkinson  Sex: male Age: 33 y.o. DOB: [birth year]  Allergies: Fish allergy and Shellfish allergy  Chief Complaint  Patient presents with  . Follow-up    HPI: Patient is 33 y.o. male who has history of diabetes, hypertension, hyperlipidemia, patient is on insulin 70/30 , 45 units twice a day, he follows up with the endocrinologist, patient denies any hypoglycemic symptoms, his blood pressure is well controlled denies any acute symptoms denies any headache dizziness chest and shortness of breath.  Past Medical History  Diagnosis Date  . Diabetes mellitus without complication     History reviewed. No pertinent past surgical history.    Medication List       This list is accurate as of: 07/10/13 10:58 AM.  Always use your most recent med list.               ACCU-CHEK NANO SMARTVIEW W/DEVICE Kit  Check blood sugars 4 times/day     Alcohol Swabs Pads  1 Container by Does not apply route 2 (two) times daily.     atorvastatin 10 MG tablet  Commonly known as:  LIPITOR  Take 1 tablet (10 mg total) by mouth daily.     FINGERSTIX LANCETS Misc  1 Container by Does not apply route 2 (two) times daily.     glucose blood test strip  - Use as instructed  -   - Check blood sugar 4 times/day     insulin NPH-regular Human (70-30) 100 UNIT/ML injection  Commonly known as:  NOVOLIN 70/30  Inject 45 Units into the skin 2 (two) times daily with a meal.     lisinopril 10 MG tablet  Commonly known as:  PRINIVIL,ZESTRIL  Take 1 tablet (10 mg total) by mouth daily.     multivitamin with minerals Tabs tablet  Take 1 tablet by mouth daily.     pantoprazole 40 MG tablet  Commonly known as:  PROTONIX  Take 1 tablet (40 mg total) by mouth daily.        No orders of the defined types were placed in this encounter.    Immunization History  Administered Date(s) Administered  . Influenza,inj,Quad PF,36+ Mos 03/24/2013  . Pneumococcal  Polysaccharide-23 03/24/2013    Family History  Problem Relation Age of Onset  . Lung cancer Father   . Diabetes Other     History  Substance Use Topics  . Smoking status: Former Smoker -- 0.50 packs/day    Types: Cigarettes    Quit date: 12/15/2012  . Smokeless tobacco: Never Used  . Alcohol Use: No    Review of Systems   As noted in HPI  Filed Vitals:   07/10/13 1046  BP: 130/76  Pulse: 76  Temp: 98.4 F (36.9 C)  Resp: 16    Physical Exam  Physical Exam  Constitutional: No distress.  Eyes: EOM are normal. Pupils are equal, round, and reactive to light.  Cardiovascular: Normal rate and regular rhythm.   Pulmonary/Chest: Breath sounds normal. No respiratory distress. He has no wheezes. He has no rales.    CBC    Component Value Date/Time   WBC 9.9 12/19/2012 0513   RBC 4.30 12/19/2012 0513   HGB 12.9* 12/19/2012 0513   HCT 37.5* 12/19/2012 0513   PLT 182 12/19/2012 0513   MCV 87.2 12/19/2012 0513   LYMPHSABS 1.3 12/15/2012 1130   MONOABS 1.8* 12/15/2012 1130   EOSABS 0.0 12/15/2012 1130   BASOSABS 0.0 12/15/2012  1130    CMP     Component Value Date/Time   NA 137 03/24/2013 1203   K 3.6 03/24/2013 1203   CL 104 03/24/2013 1203   CO2 26 03/24/2013 1203   GLUCOSE 96 03/24/2013 1203   BUN 14 03/24/2013 1203   CREATININE 1.1 03/24/2013 1203   CREATININE 1.01 01/03/2013 1053   CALCIUM 9.6 03/24/2013 1203   PROT 8.5* 12/15/2012 0957   ALBUMIN 2.9* 12/19/2012 0500   AST 26 12/15/2012 0957   ALT 36 12/15/2012 0957   ALKPHOS 91 12/15/2012 0957   BILITOT 0.2* 12/15/2012 0957   GFRNONAA >90 12/19/2012 0500   GFRAA >90 12/19/2012 0500    Lab Results  Component Value Date/Time   CHOL 189 02/02/2013 10:39 AM    No components found with this basename: hga1c    Lab Results  Component Value Date/Time   AST 26 12/15/2012  9:57 AM    Assessment and Plan  Essential hypertension, benign - Plan: COMPLETE METABOLIC PANEL WITH GFR  Diabetes - Plan: Continue with  current medication, following up with endocrinologist, will repeat blood chemistry prior to the next visit  COMPLETE METABOLIC PANEL WITH GFR  Dyslipidemia - Plan: Patient is on Lipitor 10 mg daily, will repeat fasting Lipid panel Return in about 3 months (around 10/10/2013).  Lorayne Marek, MD

## 2013-07-10 NOTE — Progress Notes (Signed)
Patient here for follow up-DM Keeps a log of his blood sugars Blood sugar this am 110

## 2013-09-05 ENCOUNTER — Encounter: Payer: Self-pay | Admitting: Endocrinology

## 2013-09-05 ENCOUNTER — Ambulatory Visit (INDEPENDENT_AMBULATORY_CARE_PROVIDER_SITE_OTHER): Payer: Medicaid Other | Admitting: Endocrinology

## 2013-09-05 VITALS — BP 116/80 | HR 80 | Temp 97.5°F | Ht 69.0 in | Wt 225.0 lb

## 2013-09-05 DIAGNOSIS — IMO0002 Reserved for concepts with insufficient information to code with codable children: Secondary | ICD-10-CM

## 2013-09-05 DIAGNOSIS — E1065 Type 1 diabetes mellitus with hyperglycemia: Secondary | ICD-10-CM

## 2013-09-05 DIAGNOSIS — E108 Type 1 diabetes mellitus with unspecified complications: Principal | ICD-10-CM

## 2013-09-05 LAB — HEMOGLOBIN A1C: HEMOGLOBIN A1C: 6.7 % — AB (ref 4.6–6.5)

## 2013-09-05 NOTE — Patient Instructions (Addendum)
check your blood sugar 4 times a day: before the 3 meals, and at bedtime.  also check if you have symptoms of your blood sugar being too high or too low.  please keep a record of the readings and bring it to your next appointment here.  You can write it on any piece of paper.  please call us sooner if your blood sugar goes below 70, or if you have a lot of readings over 200.   A diabetes blood test is requested for you today.  We'll contact you with results.   Please come back for a follow-up appointment in 3 months.   It is possible that you could go through a phase in which you need less insulin.  However, you should not go off the insulin altogether unless we decide this together.

## 2013-09-05 NOTE — Progress Notes (Signed)
Subjective:    Patient ID: Justin Wilkinson, male    DOB: 12/14/1980, 33 y.o.   MRN: [id number]  HPI Pt returns for f/u of type 1 DM (dx;ed august of 2014, when pt was admitted with dka; he had no previous h/o DM; he has been on insulin ever since then; he has mild if any neuropathy of the lower extremities; no assoc complications; he is willing to take insulin only as often as qd).  no cbg record, but states cbg's vary from 100-143.  It is in general higher as the day goes on.  pt states he feels well in general.  He says he does not miss the insulin.   Past Medical History  Diagnosis Date  . Diabetes mellitus without complication     No past surgical history on file.  History   Social History  . Marital Status: Single    Spouse Name: N/A    Number of Children: N/A  . Years of Education: N/A   Occupational History  . Not on file.   Social History Main Topics  . Smoking status: Former Smoker -- 0.50 packs/day    Types: Cigarettes    Quit date: 12/15/2012  . Smokeless tobacco: Never Used  . Alcohol Use: No  . Drug Use: No  . Sexual Activity: No   Other Topics Concern  . Not on file   Social History Narrative  . No narrative on file    Current Outpatient Prescriptions on File Prior to Visit  Medication Sig Dispense Refill  . Alcohol Swabs PADS 1 Container by Does not apply route 2 (two) times daily.  100 each  12  . atorvastatin (LIPITOR) 10 MG tablet Take 1 tablet (10 mg total) by mouth daily.  90 tablet  3  . Blood Glucose Monitoring Suppl (ACCU-CHEK NANO SMARTVIEW) W/DEVICE KIT Check blood sugars 4 times/day  1 kit  0  . Fingerstix Lancets MISC 1 Container by Does not apply route 2 (two) times daily.  200 each  12  . glucose blood test strip Use as instructed  Check blood sugar 4 times/day  100 each  12  . lisinopril (PRINIVIL,ZESTRIL) 10 MG tablet Take 1 tablet (10 mg total) by mouth daily.  90 tablet  3  . Multiple Vitamin (MULTIVITAMIN WITH MINERALS) TABS tablet  Take 1 tablet by mouth daily.      . pantoprazole (PROTONIX) 40 MG tablet Take 1 tablet (40 mg total) by mouth daily.  30 tablet  3   No current facility-administered medications on file prior to visit.    Allergies  Allergen Reactions  . Fish Allergy Anaphylaxis  . Shellfish Allergy Anaphylaxis    Family History  Problem Relation Age of Onset  . Lung cancer Father   . Diabetes Other     BP 116/80  Pulse 80  Temp(Src) 97.5 F (36.4 C) (Oral)  Ht 5' 9"  (1.753 m)  Wt 225 lb (102.059 kg)  BMI 33.21 kg/m2  SpO2 91%  Review of Systems He denies hypoglycemia and weight change.    Objective:   Physical Exam VITAL SIGNS:  See vs page GENERAL: no distress  Lab Results  Component Value Date   HGBA1C 6.7* 09/05/2013      Assessment & Plan:  Type 1 DM: overcontrolled, given this regimen, which does not match insulin to his changing needs throughout the day.  This insulin regimen was chosen from multiple options, for its simplicity.  The benefits of glycemic control  must be weighed against the risks of hypoglycemia.  HTN: well-controlled.

## 2013-10-10 ENCOUNTER — Ambulatory Visit: Payer: Medicaid Other | Attending: Internal Medicine | Admitting: Internal Medicine

## 2013-10-10 ENCOUNTER — Encounter: Payer: Self-pay | Admitting: Internal Medicine

## 2013-10-10 VITALS — BP 140/82 | HR 89 | Temp 98.8°F | Resp 17

## 2013-10-10 DIAGNOSIS — E8801 Alpha-1-antitrypsin deficiency: Secondary | ICD-10-CM | POA: Insufficient documentation

## 2013-10-10 DIAGNOSIS — I10 Essential (primary) hypertension: Secondary | ICD-10-CM | POA: Diagnosis not present

## 2013-10-10 DIAGNOSIS — Z87891 Personal history of nicotine dependence: Secondary | ICD-10-CM | POA: Insufficient documentation

## 2013-10-10 DIAGNOSIS — Z79899 Other long term (current) drug therapy: Secondary | ICD-10-CM | POA: Diagnosis not present

## 2013-10-10 DIAGNOSIS — E119 Type 2 diabetes mellitus without complications: Secondary | ICD-10-CM | POA: Insufficient documentation

## 2013-10-10 DIAGNOSIS — E785 Hyperlipidemia, unspecified: Secondary | ICD-10-CM

## 2013-10-10 LAB — COMPLETE METABOLIC PANEL WITH GFR
ALBUMIN: 4.4 g/dL (ref 3.5–5.2)
ALT: 22 U/L (ref 0–53)
AST: 18 U/L (ref 0–37)
Alkaline Phosphatase: 43 U/L (ref 39–117)
BUN: 13 mg/dL (ref 6–23)
CO2: 26 meq/L (ref 19–32)
Calcium: 9.8 mg/dL (ref 8.4–10.5)
Chloride: 104 mEq/L (ref 96–112)
Creat: 0.91 mg/dL (ref 0.50–1.35)
GLUCOSE: 75 mg/dL (ref 70–99)
POTASSIUM: 4 meq/L (ref 3.5–5.3)
SODIUM: 138 meq/L (ref 135–145)
TOTAL PROTEIN: 7.3 g/dL (ref 6.0–8.3)
Total Bilirubin: 0.3 mg/dL (ref 0.2–1.2)

## 2013-10-10 LAB — LIPID PANEL
Cholesterol: 170 mg/dL (ref 0–200)
HDL: 35 mg/dL — ABNORMAL LOW (ref >39)
LDL CALC: 102 mg/dL — AB (ref 0–99)
Total CHOL/HDL Ratio: 4.9 Ratio
Triglycerides: 164 mg/dL — ABNORMAL HIGH (ref <150)
VLDL: 33 mg/dL (ref 0–40)

## 2013-10-10 LAB — GLUCOSE, POCT (MANUAL RESULT ENTRY): POC GLUCOSE: 84 mg/dL (ref 70–99)

## 2013-10-10 NOTE — Patient Instructions (Signed)
DASH Diet  The DASH diet stands for "Dietary Approaches to Stop Hypertension." It is a healthy eating plan that has been shown to reduce high blood pressure (hypertension) in as little as 14 days, while also possibly providing other significant health benefits. These other health benefits include reducing the risk of breast cancer after menopause and reducing the risk of type 2 diabetes, heart disease, colon cancer, and stroke. Health benefits also include weight loss and slowing kidney failure in patients with chronic kidney disease.   DIET GUIDELINES  · Limit salt (sodium). Your diet should contain less than 1500 mg of sodium daily.  · Limit refined or processed carbohydrates. Your diet should include mostly whole grains. Desserts and added sugars should be used sparingly.  · Include small amounts of heart-healthy fats. These types of fats include nuts, oils, and tub margarine. Limit saturated and trans fats. These fats have been shown to be harmful in the body.  CHOOSING FOODS   The following food groups are based on a 2000 calorie diet. See your Registered Dietitian for individual calorie needs.  Grains and Grain Products (6 to 8 servings daily)  · Eat More Often: Whole-wheat bread, brown rice, whole-grain or wheat pasta, quinoa, popcorn without added fat or salt (air popped).  · Eat Less Often: White bread, white pasta, white rice, cornbread.  Vegetables (4 to 5 servings daily)  · Eat More Often: Fresh, frozen, and canned vegetables. Vegetables may be raw, steamed, roasted, or grilled with a minimal amount of fat.  · Eat Less Often/Avoid: Creamed or fried vegetables. Vegetables in a cheese sauce.  Fruit (4 to 5 servings daily)  · Eat More Often: All fresh, canned (in natural juice), or frozen fruits. Dried fruits without added sugar. One hundred percent fruit juice (½ cup [237 mL] daily).  · Eat Less Often: Dried fruits with added sugar. Canned fruit in light or heavy syrup.  Lean Meats, Fish, and Poultry (2  servings or less daily. One serving is 3 to 4 oz [85-114 g]).  · Eat More Often: Ninety percent or leaner ground beef, tenderloin, sirloin. Round cuts of beef, chicken breast, turkey breast. All fish. Grill, bake, or broil your meat. Nothing should be fried.  · Eat Less Often/Avoid: Fatty cuts of meat, turkey, or chicken leg, thigh, or wing. Fried cuts of meat or fish.  Dairy (2 to 3 servings)  · Eat More Often: Low-fat or fat-free milk, low-fat plain or light yogurt, reduced-fat or part-skim cheese.  · Eat Less Often/Avoid: Milk (whole, 2%). Whole milk yogurt. Full-fat cheeses.  Nuts, Seeds, and Legumes (4 to 5 servings per week)  · Eat More Often: All without added salt.  · Eat Less Often/Avoid: Salted nuts and seeds, canned beans with added salt.  Fats and Sweets (limited)  · Eat More Often: Vegetable oils, tub margarines without trans fats, sugar-free gelatin. Mayonnaise and salad dressings.  · Eat Less Often/Avoid: Coconut oils, palm oils, butter, stick margarine, cream, half and half, cookies, candy, pie.  FOR MORE INFORMATION  The Dash Diet Eating Plan: www.dashdiet.org  Document Released: 04/09/2011 Document Revised: 07/13/2011 Document Reviewed: 04/09/2011  [employer]® Patient Information ©2014 [employer].

## 2013-10-10 NOTE — Progress Notes (Signed)
MRN: [id number] Name: Justin Wilkinson  Sex: male Age: 33 y.o. DOB: 06/29/1980  Allergies: Fish allergy and Shellfish allergy  Chief Complaint  Patient presents with  . Follow-up    HPI: Patient is 33 y.o. male who has to hypertension lipidemia comes today for followup, he denies any hypoglycemic symptoms as per patient he followed up with his endocrinologist and his evening dose of insulin was decreased, his last hemoglobin A1c 6.7%, today's blood pressure is borderline elevated denies any headache dizziness chest and shortness of breath, he has been taking his lisinopril 10 mg, patient denies smoking cigarettes.  Past Medical History  Diagnosis Date  . Diabetes mellitus without complication     History reviewed. No pertinent past surgical history.    Medication List       This list is accurate as of: 10/10/13 11:35 AM.  Always use your most recent med list.               ACCU-CHEK NANO SMARTVIEW W/DEVICE Kit  Check blood sugars 4 times/day     Alcohol Swabs Pads  1 Container by Does not apply route 2 (two) times daily.     atorvastatin 10 MG tablet  Commonly known as:  LIPITOR  Take 1 tablet (10 mg total) by mouth daily.     FINGERSTIX LANCETS Misc  1 Container by Does not apply route 2 (two) times daily.     glucose blood test strip  - Use as instructed  -   - Check blood sugar 4 times/day     insulin NPH-regular Human (70-30) 100 UNIT/ML injection  Commonly known as:  NOVOLIN 70/30  45 units with breakfast, and 40 units with the evening meal.     lisinopril 10 MG tablet  Commonly known as:  PRINIVIL,ZESTRIL  Take 1 tablet (10 mg total) by mouth daily.     multivitamin with minerals Tabs tablet  Take 1 tablet by mouth daily.     pantoprazole 40 MG tablet  Commonly known as:  PROTONIX  Take 1 tablet (40 mg total) by mouth daily.        No orders of the defined types were placed in this encounter.    Immunization History  Administered Date(s)  Administered  . Influenza,inj,Quad PF,36+ Mos 03/24/2013  . Pneumococcal Polysaccharide-23 03/24/2013    Family History  Problem Relation Age of Onset  . Lung cancer Father   . Diabetes Other     History  Substance Use Topics  . Smoking status: Former Smoker -- 0.50 packs/day    Types: Cigarettes    Quit date: 12/15/2012  . Smokeless tobacco: Never Used  . Alcohol Use: No    Review of Systems   As noted in HPI  Filed Vitals:   10/10/13 1123  BP: 140/82  Pulse: 89  Temp: 98.8 F (37.1 C)  Resp: 17    Physical Exam  Physical Exam  Constitutional: No distress.  Eyes: EOM are normal. Pupils are equal, round, and reactive to light.  Cardiovascular: Normal rate and regular rhythm.   Pulmonary/Chest: Breath sounds normal. No respiratory distress. He has no wheezes. He has no rales.  Musculoskeletal: He exhibits no edema.    CBC    Component Value Date/Time   WBC 9.9 12/19/2012 0513   RBC 4.30 12/19/2012 0513   HGB 12.9* 12/19/2012 0513   HCT 37.5* 12/19/2012 0513   PLT 182 12/19/2012 0513   MCV 87.2 12/19/2012 0513   LYMPHSABS 1.3  12/15/2012 1130   MONOABS 1.8* 12/15/2012 1130   EOSABS 0.0 12/15/2012 1130   BASOSABS 0.0 12/15/2012 1130    CMP     Component Value Date/Time   NA 137 03/24/2013 1203   K 3.6 03/24/2013 1203   CL 104 03/24/2013 1203   CO2 26 03/24/2013 1203   GLUCOSE 96 03/24/2013 1203   BUN 14 03/24/2013 1203   CREATININE 1.1 03/24/2013 1203   CREATININE 1.01 01/03/2013 1053   CALCIUM 9.6 03/24/2013 1203   PROT 8.5* 12/15/2012 0957   ALBUMIN 2.9* 12/19/2012 0500   AST 26 12/15/2012 0957   ALT 36 12/15/2012 0957   ALKPHOS 91 12/15/2012 0957   BILITOT 0.2* 12/15/2012 0957   GFRNONAA >90 12/19/2012 0500   GFRAA >90 12/19/2012 0500    Lab Results  Component Value Date/Time   CHOL 189 02/02/2013 10:39 AM    No components found with this basename: hga1c    Lab Results  Component Value Date/Time   AST 26 12/15/2012  9:57 AM    Assessment and  Plan  DM (diabetes mellitus) - Plan:  Results for orders placed in visit on 10/10/13  GLUCOSE, POCT (MANUAL RESULT ENTRY)      Result Value Ref Range   POC Glucose 84  70 - 99 mg/dl   Diabetes is well controlled continue with current dose of insulin and following up with endocrinologist  Dyslipidemia - Plan: Will repeat  Lipid panel, patient is taking Lipitor 10 mg.  Essential hypertension, benign - Plan: Have advised patient for DASH diet no continued lisinopril 10 mg, will repeat blood chemistry COMPLETE METABOLIC PANEL WITH GFR     Return in about 4 months (around 02/09/2014) for diabetes, hypertension, hyperipidemia.  Lorayne Marek, MD

## 2013-10-10 NOTE — Progress Notes (Signed)
Patient here for follow up on his DM 

## 2013-10-11 ENCOUNTER — Telehealth: Payer: Self-pay

## 2013-10-11 NOTE — Telephone Encounter (Signed)
Message copied by Lestine Mount on Wed Oct 11, 2013  9:52 AM ------      Message from: Doris Cheadle      Created: Wed Oct 11, 2013  9:19 AM       A blood work reviewed, call and let the patient know that his cholesterol is improving, continue with Lipitor and triglycerides are still borderline elevated, advise for low fat diet. ------

## 2013-10-11 NOTE — Telephone Encounter (Signed)
Patient is aware of his lab results 

## 2013-12-12 ENCOUNTER — Encounter: Payer: Self-pay | Admitting: Endocrinology

## 2013-12-12 ENCOUNTER — Ambulatory Visit (INDEPENDENT_AMBULATORY_CARE_PROVIDER_SITE_OTHER): Payer: Medicaid Other | Admitting: Endocrinology

## 2013-12-12 VITALS — BP 124/88 | HR 89 | Temp 97.5°F | Ht 69.0 in | Wt 217.0 lb

## 2013-12-12 DIAGNOSIS — IMO0002 Reserved for concepts with insufficient information to code with codable children: Secondary | ICD-10-CM

## 2013-12-12 DIAGNOSIS — E1065 Type 1 diabetes mellitus with hyperglycemia: Secondary | ICD-10-CM

## 2013-12-12 DIAGNOSIS — E108 Type 1 diabetes mellitus with unspecified complications: Principal | ICD-10-CM

## 2013-12-12 LAB — HEMOGLOBIN A1C: Hgb A1c MFr Bld: 11 % — ABNORMAL HIGH (ref 4.6–6.5)

## 2013-12-12 MED ORDER — INSULIN NPH ISOPHANE & REGULAR (70-30) 100 UNIT/ML ~~LOC~~ SUSP: SUBCUTANEOUS | Status: DC

## 2013-12-12 NOTE — Progress Notes (Signed)
Subjective:    Patient ID: Justin Wilkinson, male    DOB: 04-Oct-1980, 33 y.o.   MRN: [id number]  HPI Pt returns for f/u of type 1 DM (dx;ed august of 2014, when pt was admitted with DKA; he had no previous h/o DM; he has been on insulin ever since then; he has mild if any neuropathy of the lower extremities; no assoc complications; He has never had pancreatitis or severe hypoglycemia; he is willing to take insulin only as often as qd).  Pt says he has not taken insulin x 1 month. no cbg record, but states cbg's are approx 100.   Past Medical History  Diagnosis Date  . Diabetes mellitus without complication     No past surgical history on file.  History   Social History  . Marital Status: Single    Spouse Name: N/A    Number of Children: N/A  . Years of Education: N/A   Occupational History  . Not on file.   Social History Main Topics  . Smoking status: Former Smoker -- 0.50 packs/day    Types: Cigarettes    Quit date: 12/15/2012  . Smokeless tobacco: Never Used  . Alcohol Use: No  . Drug Use: No  . Sexual Activity: No   Other Topics Concern  . Not on file   Social History Narrative  . No narrative on file    Current Outpatient Prescriptions on File Prior to Visit  Medication Sig Dispense Refill  . Alcohol Swabs PADS 1 Container by Does not apply route 2 (two) times daily.  100 each  12  . atorvastatin (LIPITOR) 10 MG tablet Take 1 tablet (10 mg total) by mouth daily.  90 tablet  3  . Blood Glucose Monitoring Suppl (ACCU-CHEK NANO SMARTVIEW) W/DEVICE KIT Check blood sugars 4 times/day  1 kit  0  . Fingerstix Lancets MISC 1 Container by Does not apply route 2 (two) times daily.  200 each  12  . glucose blood test strip Use as instructed  Check blood sugar 4 times/day  100 each  12  . lisinopril (PRINIVIL,ZESTRIL) 10 MG tablet Take 1 tablet (10 mg total) by mouth daily.  90 tablet  3  . Multiple Vitamin (MULTIVITAMIN WITH MINERALS) TABS tablet Take 1 tablet by mouth  daily.      . pantoprazole (PROTONIX) 40 MG tablet Take 1 tablet (40 mg total) by mouth daily.  30 tablet  3   No current facility-administered medications on file prior to visit.    Allergies  Allergen Reactions  . Fish Allergy Anaphylaxis  . Shellfish Allergy Anaphylaxis    Family History  Problem Relation Age of Onset  . Lung cancer Father   . Diabetes Other     BP 124/88  Pulse 89  Temp(Src) 97.5 F (36.4 C) (Oral)  Ht _0  (1.753 m)  Wt 217 lb (98.431 kg)  BMI 32.03 kg/m2  SpO2 95%   Review of Systems He denies hypoglycemia and weight change.      Objective:   Physical Exam VITAL SIGNS:  See vs page GENERAL: no distress Pulses: dorsalis pedis intact bilat.   Feet: no deformity. normal color and temp.  no edema  Skin:  no ulcer on the feet.   Neuro: sensation is intact to touch on the feet.     Lab Results  Component Value Date   HGBA1C 11.0* 12/12/2013      Assessment & Plan:  DM: severe exacerbation Noncompliance with  cbg recording (a1c is much higher than would be predicted by reported cbg's), and insulin: I'll work around this as best I can.  He is advised to resume insulin.   Patient is advised the following: Patient Instructions  check your blood sugar once a day: before the 3 meals, and at bedtime.  also check if you have symptoms of your blood sugar being too high or too low.  please keep a record of the readings and bring it to your next appointment here.  You can write it on any piece of paper.  please call us sooner if your blood sugar goes below 70, or if you have a lot of readings over 200.   A diabetes blood test is requested for you today.  We'll contact you with results.   Please come back for a follow-up appointment in 3 months.   It is possible that you are in a phase in which you don't insulin.  This usually only lasts a few months at most, so please carefully check the blood sugar.

## 2013-12-12 NOTE — Patient Instructions (Addendum)
check your blood sugar once a day: before the 3 meals, and at bedtime.  also check if you have symptoms of your blood sugar being too high or too low.  please keep a record of the readings and bring it to your next appointment here.  You can write it on any piece of paper.  please call us sooner if your blood sugar goes below 70, or if you have a lot of readings over 200.   A diabetes blood test is requested for you today.  We'll contact you with results.   Please come back for a follow-up appointment in 3 months.   It is possible that you are in a phase in which you don't insulin.  This usually only lasts a few months at most, so please carefully check the blood sugar.

## 2014-02-09 ENCOUNTER — Encounter: Payer: Self-pay | Admitting: Internal Medicine

## 2014-02-09 ENCOUNTER — Ambulatory Visit: Payer: Medicaid Other | Attending: Internal Medicine | Admitting: Internal Medicine

## 2014-02-09 VITALS — BP 123/79 | HR 76 | Temp 98.4°F | Resp 17 | Wt 198.2 lb

## 2014-02-09 DIAGNOSIS — Z79899 Other long term (current) drug therapy: Secondary | ICD-10-CM | POA: Diagnosis not present

## 2014-02-09 DIAGNOSIS — Z833 Family history of diabetes mellitus: Secondary | ICD-10-CM | POA: Diagnosis not present

## 2014-02-09 DIAGNOSIS — Z801 Family history of malignant neoplasm of trachea, bronchus and lung: Secondary | ICD-10-CM | POA: Diagnosis not present

## 2014-02-09 DIAGNOSIS — Z87891 Personal history of nicotine dependence: Secondary | ICD-10-CM | POA: Diagnosis not present

## 2014-02-09 DIAGNOSIS — E109 Type 1 diabetes mellitus without complications: Secondary | ICD-10-CM

## 2014-02-09 DIAGNOSIS — Z794 Long term (current) use of insulin: Secondary | ICD-10-CM | POA: Insufficient documentation

## 2014-02-09 DIAGNOSIS — Z09 Encounter for follow-up examination after completed treatment for conditions other than malignant neoplasm: Secondary | ICD-10-CM | POA: Insufficient documentation

## 2014-02-09 DIAGNOSIS — Z23 Encounter for immunization: Secondary | ICD-10-CM

## 2014-02-09 DIAGNOSIS — I10 Essential (primary) hypertension: Secondary | ICD-10-CM | POA: Diagnosis not present

## 2014-02-09 DIAGNOSIS — E119 Type 2 diabetes mellitus without complications: Secondary | ICD-10-CM

## 2014-02-09 DIAGNOSIS — E785 Hyperlipidemia, unspecified: Secondary | ICD-10-CM | POA: Diagnosis not present

## 2014-02-09 LAB — COMPLETE METABOLIC PANEL WITH GFR
ALT: 10 U/L (ref 0–53)
AST: 10 U/L (ref 0–37)
Albumin: 4.2 g/dL (ref 3.5–5.2)
Alkaline Phosphatase: 61 U/L (ref 39–117)
BILIRUBIN TOTAL: 0.5 mg/dL (ref 0.2–1.2)
BUN: 13 mg/dL (ref 6–23)
CHLORIDE: 96 meq/L (ref 96–112)
CO2: 21 meq/L (ref 19–32)
CREATININE: 0.96 mg/dL (ref 0.50–1.35)
Calcium: 9.8 mg/dL (ref 8.4–10.5)
GFR, Est African American: >89 mL/min
GFR, Est Non African American: >89 mL/min
Glucose, Bld: 265 mg/dL — ABNORMAL HIGH (ref 70–99)
POTASSIUM: 4.2 meq/L (ref 3.5–5.3)
Sodium: 136 mEq/L (ref 135–145)
TOTAL PROTEIN: 7.5 g/dL (ref 6.0–8.3)

## 2014-02-09 LAB — LIPID PANEL
CHOL/HDL RATIO: 6.6 ratio
Cholesterol: 198 mg/dL (ref 0–200)
HDL: 30 mg/dL — AB (ref >39)
LDL Cholesterol: 125 mg/dL — ABNORMAL HIGH (ref 0–99)
Triglycerides: 214 mg/dL — ABNORMAL HIGH (ref <150)
VLDL: 43 mg/dL — AB (ref 0–40)

## 2014-02-09 LAB — GLUCOSE, POCT (MANUAL RESULT ENTRY): POC Glucose: 285 mg/dl — AB (ref 70–99)

## 2014-02-09 MED ORDER — INSULIN ASPART 100 UNIT/ML ~~LOC~~ SOLN
10.0000 [IU] | Freq: Once | SUBCUTANEOUS | Status: AC
  Administered 2014-02-09: 10 [IU] via SUBCUTANEOUS

## 2014-02-09 NOTE — Patient Instructions (Signed)
Diabetes Mellitus and Food It is important for you to manage your blood sugar (glucose) level. Your blood glucose level can be greatly affected by what you eat. Eating healthier foods in the appropriate amounts throughout the day at about the same time each day will help you control your blood glucose level. It can also help slow or prevent worsening of your diabetes mellitus. Healthy eating may even help you improve the level of your blood pressure and reach or maintain a healthy weight.  HOW CAN FOOD AFFECT ME? Carbohydrates Carbohydrates affect your blood glucose level more than any other type of food. Your dietitian will help you determine how many carbohydrates to eat at each meal and teach you how to count carbohydrates. Counting carbohydrates is important to keep your blood glucose at a healthy level, especially if you are using insulin or taking certain medicines for diabetes mellitus. Alcohol Alcohol can cause sudden decreases in blood glucose (hypoglycemia), especially if you use insulin or take certain medicines for diabetes mellitus. Hypoglycemia can be a life-threatening condition. Symptoms of hypoglycemia (sleepiness, dizziness, and disorientation) are similar to symptoms of having too much alcohol.  If your health care provider has given you approval to drink alcohol, do so in moderation and use the following guidelines:  Women should not have more than one drink per day, and men should not have more than two drinks per day. One drink is equal to:  12 oz of beer.  5 oz of wine.  1 oz of hard liquor.  Do not drink on an empty stomach.  Keep yourself hydrated. Have water, diet soda, or unsweetened iced tea.  Regular soda, juice, and other mixers might contain a lot of carbohydrates and should be counted. WHAT FOODS ARE NOT RECOMMENDED? As you make food choices, it is important to remember that all foods are not the same. Some foods have fewer nutrients per serving than other  foods, even though they might have the same number of calories or carbohydrates. It is difficult to get your body what it needs when you eat foods with fewer nutrients. Examples of foods that you should avoid that are high in calories and carbohydrates but low in nutrients include:  Trans fats (most processed foods list trans fats on the Nutrition Facts label).  Regular soda.  Juice.  Candy.  Sweets, such as cake, pie, doughnuts, and cookies.  Fried foods. WHAT FOODS CAN I EAT? Have nutrient-rich foods, which will nourish your body and keep you healthy. The food you should eat also will depend on several factors, including:  The calories you need.  The medicines you take.  Your weight.  Your blood glucose level.  Your blood pressure level.  Your cholesterol level. You also should eat a variety of foods, including:  Protein, such as meat, poultry, fish, tofu, nuts, and seeds (lean animal proteins are best).  Fruits.  Vegetables.  Dairy products, such as milk, cheese, and yogurt (low fat is best).  Breads, grains, pasta, cereal, rice, and beans.  Fats such as olive oil, trans fat-free margarine, canola oil, avocado, and olives. DOES EVERYONE WITH DIABETES MELLITUS HAVE THE SAME MEAL PLAN? Because every person with diabetes mellitus is different, there is not one meal plan that works for everyone. It is very important that you meet with a dietitian who will help you create a meal plan that is just right for you. Document Released: 01/15/2005 Document Revised: 04/25/2013 Document Reviewed: 03/17/2013 [employer] Patient Information 2015 [employer]. This   information is not intended to replace advice given to you by your health care provider. Make sure you discuss any questions you have with your health care provider.  

## 2014-02-09 NOTE — Progress Notes (Signed)
Patient here for follow up on his DM 

## 2014-02-09 NOTE — Progress Notes (Signed)
MRN: [id number] Name: Justin Wilkinson  Sex: male Age: 33 y.o. DOB: [birth year]  Allergies: Fish allergy and Shellfish allergy  Chief Complaint  Patient presents with  . Follow-up    HPI: Patient is 33 y.o. male who has history of type 1 diabetes hypertension and lipidemia comes today for followup, patient denies any hypoglycemic symptoms and usually his blood sugar is between 90-130 mg/dL, he also follows up with his endocrinologist and already has scheduled appointment next month, his blood pressure is well controlled, denies any headache dizziness chest and shortness of breath, he has been compliant in taking his medications.  Past Medical History  Diagnosis Date  . Diabetes mellitus without complication     History reviewed. No pertinent past surgical history.    Medication List       This list is accurate as of: 02/09/14 11:37 AM.  Always use your most recent med list.               ACCU-CHEK NANO SMARTVIEW W/DEVICE Kit  Check blood sugars 4 times/day     Alcohol Swabs Pads  1 Container by Does not apply route 2 (two) times daily.     atorvastatin 10 MG tablet  Commonly known as:  LIPITOR  Take 1 tablet (10 mg total) by mouth daily.     FINGERSTIX LANCETS Misc  1 Container by Does not apply route 2 (two) times daily.     glucose blood test strip  - Use as instructed  -   - Check blood sugar 4 times/day     insulin NPH-regular Human (70-30) 100 UNIT/ML injection  Commonly known as:  NOVOLIN 70/30  20 units with breakfast, and 10 units with the evening meal.     lisinopril 10 MG tablet  Commonly known as:  PRINIVIL,ZESTRIL  Take 1 tablet (10 mg total) by mouth daily.     multivitamin with minerals Tabs tablet  Take 1 tablet by mouth daily.     pantoprazole 40 MG tablet  Commonly known as:  PROTONIX  Take 1 tablet (40 mg total) by mouth daily.        Meds ordered this encounter  Medications  . insulin aspart (novoLOG) injection 10 Units   Sig:     Immunization History  Administered Date(s) Administered  . Influenza,inj,Quad PF,36+ Mos 03/24/2013  . Pneumococcal Polysaccharide-23 03/24/2013    Family History  Problem Relation Age of Onset  . Lung cancer Father   . Diabetes Other     History  Substance Use Topics  . Smoking status: Former Smoker -- 0.50 packs/day    Types: Cigarettes    Quit date: 12/15/2012  . Smokeless tobacco: Never Used  . Alcohol Use: No    Review of Systems   As noted in HPI  Filed Vitals:   02/09/14 1113  BP: 123/79  Pulse: 76  Temp: 98.4 F (36.9 C)  Resp: 17    Physical Exam  Physical Exam  Constitutional: No distress.  Eyes: EOM are normal. Pupils are equal, round, and reactive to light.  Cardiovascular: Normal rate and regular rhythm.   Pulmonary/Chest: Breath sounds normal. No respiratory distress. He has no wheezes. He has no rales.  Musculoskeletal: He exhibits no edema.    CBC    Component Value Date/Time   WBC 9.9 12/19/2012 0513   RBC 4.30 12/19/2012 0513   HGB 12.9* 12/19/2012 0513   HCT 37.5* 12/19/2012 0513   PLT 182 12/19/2012 0513  MCV 87.2 12/19/2012 0513   LYMPHSABS 1.3 12/15/2012 1130   MONOABS 1.8* 12/15/2012 1130   EOSABS 0.0 12/15/2012 1130   BASOSABS 0.0 12/15/2012 1130    CMP     Component Value Date/Time   NA 138 10/10/2013 1138   K 4.0 10/10/2013 1138   CL 104 10/10/2013 1138   CO2 26 10/10/2013 1138   GLUCOSE 75 10/10/2013 1138   BUN 13 10/10/2013 1138   CREATININE 0.91 10/10/2013 1138   CREATININE 1.1 03/24/2013 1203   CALCIUM 9.8 10/10/2013 1138   PROT 7.3 10/10/2013 1138   ALBUMIN 4.4 10/10/2013 1138   AST 18 10/10/2013 1138   ALT 22 10/10/2013 1138   ALKPHOS 43 10/10/2013 1138   BILITOT 0.3 10/10/2013 1138   GFRNONAA >89 10/10/2013 1138   GFRNONAA >90 12/19/2012 0500   GFRAA >89 10/10/2013 1138   GFRAA >90 12/19/2012 0500    Lab Results  Component Value Date/Time   CHOL 170 10/10/2013 11:38 AM    No components found with this basename: hga1c    Lab  Results  Component Value Date/Time   AST 18 10/10/2013 11:38 AM    Assessment and Plan  Type 1 diabetes mellitus without complication - Plan:  Results for orders placed in visit on 02/09/14  GLUCOSE, POCT (MANUAL RESULT ENTRY)      Result Value Ref Range   POC Glucose 285 (*) 70 - 99 mg/dl   today his blood sugar is elevated he was given insulin aspart (novoLOG) injection 10 Units, advised patient for diabetes meal planning continue current dosage of insulin already scheduled to follow with his endocrinologist next month.  Dyslipidemia - Plan: Currently patient is on Lipitor 10 mg, will do fasting Lipid panel  Essential hypertension, benign - Plan: Blood pressure is well controlled, will check blood chemistry COMPLETE METABOLIC PANEL WITH GFR   Health Maintenance   -Influenza shot today   Return in about 3 months (around 05/12/2014) for hypertension, hyperipidemia, diabetes.  Lorayne Marek, MD

## 2014-02-19 ENCOUNTER — Telehealth: Payer: Self-pay | Admitting: *Deleted

## 2014-02-19 NOTE — Telephone Encounter (Signed)
Pt aware of lab results, Advice to start taking Lipitor 20 mg per day

## 2014-02-19 NOTE — Telephone Encounter (Signed)
Message copied by Dyann Kief on Mon Feb 19, 2014  2:31 PM ------      Message from: Doris Cheadle      Created: Mon Feb 19, 2014 11:13 AM       Blood work reviewed still cholesterol is elevated, advise patient for low fat diet, increase Lipitor to 20 mg will repeat lipid panel on the next visit. ------

## 2014-03-14 ENCOUNTER — Ambulatory Visit (INDEPENDENT_AMBULATORY_CARE_PROVIDER_SITE_OTHER): Payer: Medicaid Other | Admitting: Endocrinology

## 2014-03-14 ENCOUNTER — Encounter: Payer: Self-pay | Admitting: Endocrinology

## 2014-03-14 VITALS — BP 124/88 | HR 77 | Temp 97.5°F | Ht 69.0 in | Wt 203.0 lb

## 2014-03-14 DIAGNOSIS — E1069 Type 1 diabetes mellitus with other specified complication: Secondary | ICD-10-CM

## 2014-03-14 DIAGNOSIS — E108 Type 1 diabetes mellitus with unspecified complications: Principal | ICD-10-CM

## 2014-03-14 DIAGNOSIS — IMO0002 Reserved for concepts with insufficient information to code with codable children: Secondary | ICD-10-CM

## 2014-03-14 DIAGNOSIS — E1065 Type 1 diabetes mellitus with hyperglycemia: Secondary | ICD-10-CM

## 2014-03-14 LAB — MICROALBUMIN / CREATININE URINE RATIO
Creatinine,U: 244.3 mg/dL
Microalb Creat Ratio: 0.4 mg/g (ref 0.0–30.0)
Microalb, Ur: 0.9 mg/dL (ref 0.0–1.9)

## 2014-03-14 LAB — HEMOGLOBIN A1C: HEMOGLOBIN A1C: 14.6 % — AB (ref 4.6–6.5)

## 2014-03-14 MED ORDER — GLUCOSE BLOOD VI STRP: 1.0000 | ORAL_STRIP | Freq: Two times a day (BID) | Status: DC

## 2014-03-14 NOTE — Progress Notes (Signed)
Subjective:    Patient ID: Justin Wilkinson, male    DOB: 02/28/1981, 33 y.o.   MRN: [id number]  HPI  Pt returns for f/u of diabetes mellitus: DM type: 1 Dx'ed: 2014 Complications: none Therapy: insulin since dx DKA: only at dx Severe hypoglycemia: never Pancreatitis: never Other: he is willing to take insulin only as often as qd Interval history: He resumed insulin in august of 2015.  no cbg record, but states cbg's are well-controlled.  There is no trend throughout the day.  He says he never misses the insulin.  pt states he feels well in general. Past Medical History  Diagnosis Date  . Diabetes mellitus without complication     No past surgical history on file.  History   Social History  . Marital Status: Single    Spouse Name: N/A    Number of Children: N/A  . Years of Education: N/A   Occupational History  . Not on file.   Social History Main Topics  . Smoking status: Former Smoker -- 0.50 packs/day    Types: Cigarettes    Quit date: 12/15/2012  . Smokeless tobacco: Never Used  . Alcohol Use: No  . Drug Use: No  . Sexual Activity: No   Other Topics Concern  . Not on file   Social History Narrative    Current Outpatient Prescriptions on File Prior to Visit  Medication Sig Dispense Refill  . Alcohol Swabs PADS 1 Container by Does not apply route 2 (two) times daily. 100 each 12  . atorvastatin (LIPITOR) 10 MG tablet Take 1 tablet (10 mg total) by mouth daily. 90 tablet 3  . Fingerstix Lancets MISC 1 Container by Does not apply route 2 (two) times daily. 200 each 12  . insulin NPH-regular Human (NOVOLIN 70/30) (70-30) 100 UNIT/ML injection 20 units with breakfast, and 10 units with the evening meal. 10 mL 11  . lisinopril (PRINIVIL,ZESTRIL) 10 MG tablet Take 1 tablet (10 mg total) by mouth daily. 90 tablet 3  . Multiple Vitamin (MULTIVITAMIN WITH MINERALS) TABS tablet Take 1 tablet by mouth daily.    . pantoprazole (PROTONIX) 40 MG tablet Take 1 tablet (40 mg  total) by mouth daily. 30 tablet 3   No current facility-administered medications on file prior to visit.    Allergies  Allergen Reactions  . Fish Allergy Anaphylaxis  . Shellfish Allergy Anaphylaxis    Family History  Problem Relation Age of Onset  . Lung cancer Father   . Diabetes Other     BP 124/88 mmHg  Pulse 77  Temp(Src) 97.5 F (36.4 C) (Oral)  Ht 5\' 9"  (1.753 m)  Wt 203 lb (92.08 kg)  BMI 29.96 kg/m2  SpO2 93%    Review of Systems He denies hypoglycemia and weight change.    Objective:   Physical Exam VITAL SIGNS:  See vs page GENERAL: no distress Pulses: dorsalis pedis intact bilat.   Feet: no deformity.  no edema Skin:  no ulcer on the feet.  normal color and temp. Neuro: sensation is intact to touch on the feet  Lab Results  Component Value Date   HGBA1C 14.6* 03/14/2014       Assessment & Plan:  DM: severe exacerbation. Noncompliance with cbg recording: he most likely has noncompliance with insulin injections also: I'll work around this as best I can, but he should consider qd insulin.    Patient is advised the following: Patient Instructions  check your blood sugar twice a  day: before the 3 meals, and at bedtime.  also check if you have symptoms of your blood sugar being too high or too low.  please keep a record of the readings and bring it to your next appointment here.  You can write it on any piece of paper.  please call us sooner if your blood sugar goes below 70, or if you have a lot of readings over 200.   Blood and urine tests are being requested for you today.  We'll contact you with results. Please come back for a follow-up appointment in 3 months.      Addendum: Do you want to change to once a day insulin? Please let me know.

## 2014-03-14 NOTE — Patient Instructions (Addendum)
check your blood sugar twice a day: before the 3 meals, and at bedtime.  also check if you have symptoms of your blood sugar being too high or too low.  please keep a record of the readings and bring it to your next appointment here.  You can write it on any piece of paper.  please call us sooner if your blood sugar goes below 70, or if you have a lot of readings over 200.   Blood and urine tests are being requested for you today.  We'll contact you with results. Please come back for a follow-up appointment in 3 months.

## 2014-05-16 ENCOUNTER — Ambulatory Visit: Payer: Medicaid Other | Admitting: Internal Medicine

## 2014-05-18 ENCOUNTER — Telehealth: Payer: Self-pay | Admitting: *Deleted

## 2014-05-18 ENCOUNTER — Other Ambulatory Visit: Payer: Self-pay | Admitting: Internal Medicine

## 2014-05-18 MED ORDER — FINGERSTIX LANCETS MISC: 1.0000 | Freq: Two times a day (BID) | Status: DC

## 2014-05-18 NOTE — Telephone Encounter (Signed)
Pt requesting refill on diabetes medications and supplies, pt has upcoming f/u appt. Please send to [facility] pharmacy.

## 2014-05-18 NOTE — Telephone Encounter (Signed)
Lancet refill send to [facility] pharmacy Pt need OV for futures refills, pt aware

## 2014-05-29 ENCOUNTER — Encounter: Payer: Self-pay | Admitting: Internal Medicine

## 2014-05-29 ENCOUNTER — Ambulatory Visit: Payer: Medicaid Other | Attending: Internal Medicine | Admitting: Internal Medicine

## 2014-05-29 VITALS — BP 113/75 | HR 94 | Temp 98.0°F | Resp 16 | Wt 201.4 lb

## 2014-05-29 DIAGNOSIS — Z87891 Personal history of nicotine dependence: Secondary | ICD-10-CM | POA: Diagnosis not present

## 2014-05-29 DIAGNOSIS — E1065 Type 1 diabetes mellitus with hyperglycemia: Secondary | ICD-10-CM | POA: Insufficient documentation

## 2014-05-29 DIAGNOSIS — Z794 Long term (current) use of insulin: Secondary | ICD-10-CM | POA: Insufficient documentation

## 2014-05-29 DIAGNOSIS — I10 Essential (primary) hypertension: Secondary | ICD-10-CM

## 2014-05-29 DIAGNOSIS — E139 Other specified diabetes mellitus without complications: Secondary | ICD-10-CM

## 2014-05-29 DIAGNOSIS — Z79899 Other long term (current) drug therapy: Secondary | ICD-10-CM | POA: Diagnosis not present

## 2014-05-29 DIAGNOSIS — E785 Hyperlipidemia, unspecified: Secondary | ICD-10-CM

## 2014-05-29 LAB — COMPLETE METABOLIC PANEL WITH GFR
ALBUMIN: 4.4 g/dL (ref 3.5–5.2)
ALK PHOS: 43 U/L (ref 39–117)
ALT: 12 U/L (ref 0–53)
AST: 12 U/L (ref 0–37)
BILIRUBIN TOTAL: 0.4 mg/dL (ref 0.2–1.2)
BUN: 14 mg/dL (ref 6–23)
CO2: 25 mEq/L (ref 19–32)
Calcium: 10.4 mg/dL (ref 8.4–10.5)
Chloride: 104 mEq/L (ref 96–112)
Creat: 0.84 mg/dL (ref 0.50–1.35)
GFR, Est Non African American: >89 mL/min
GLUCOSE: 66 mg/dL — AB (ref 70–99)
Potassium: 4.3 mEq/L (ref 3.5–5.3)
SODIUM: 143 meq/L (ref 135–145)
Total Protein: 7.6 g/dL (ref 6.0–8.3)

## 2014-05-29 LAB — POCT GLYCOSYLATED HEMOGLOBIN (HGB A1C): Hemoglobin A1C: 14.2

## 2014-05-29 LAB — LIPID PANEL
CHOLESTEROL: 206 mg/dL — AB (ref 0–200)
HDL: 36 mg/dL — ABNORMAL LOW (ref >39)
LDL Cholesterol: 147 mg/dL — ABNORMAL HIGH (ref 0–99)
TRIGLYCERIDES: 114 mg/dL (ref <150)
Total CHOL/HDL Ratio: 5.7 Ratio
VLDL: 23 mg/dL (ref 0–40)

## 2014-05-29 LAB — GLUCOSE, POCT (MANUAL RESULT ENTRY): POC GLUCOSE: 78 mg/dL (ref 70–99)

## 2014-05-29 MED ORDER — PANTOPRAZOLE SODIUM 40 MG PO TBEC: 40.0000 mg | DELAYED_RELEASE_TABLET | Freq: Every day | ORAL | Status: DC

## 2014-05-29 MED ORDER — INSULIN NPH ISOPHANE & REGULAR (70-30) 100 UNIT/ML ~~LOC~~ SUSP
SUBCUTANEOUS | Status: DC
Start: 2014-05-29 — End: 2014-06-14

## 2014-05-29 MED ORDER — FINGERSTIX LANCETS MISC: 1.0000 | Freq: Two times a day (BID) | Status: DC

## 2014-05-29 MED ORDER — LISINOPRIL 10 MG PO TABS: 10.0000 mg | ORAL_TABLET | Freq: Every day | ORAL | Status: DC

## 2014-05-29 MED ORDER — GLUCOSE BLOOD VI STRP: 1.0000 | ORAL_STRIP | Freq: Two times a day (BID) | Status: DC

## 2014-05-29 MED ORDER — ATORVASTATIN CALCIUM 20 MG PO TABS: 20.0000 mg | ORAL_TABLET | Freq: Every day | ORAL | Status: DC

## 2014-05-29 NOTE — Progress Notes (Signed)
Patient here for follow up on his HTN and diabetes Needs refills on his medications as well

## 2014-05-29 NOTE — Patient Instructions (Addendum)
Diabetes Mellitus and Food It is important for you to manage your blood sugar (glucose) level. Your blood glucose level can be greatly affected by what you eat. Eating healthier foods in the appropriate amounts throughout the day at about the same time each day will help you control your blood glucose level. It can also help slow or prevent worsening of your diabetes mellitus. Healthy eating may even help you improve the level of your blood pressure and reach or maintain a healthy weight.  HOW CAN FOOD AFFECT ME? Carbohydrates Carbohydrates affect your blood glucose level more than any other type of food. Your dietitian will help you determine how many carbohydrates to eat at each meal and teach you how to count carbohydrates. Counting carbohydrates is important to keep your blood glucose at a healthy level, especially if you are using insulin or taking certain medicines for diabetes mellitus. Alcohol Alcohol can cause sudden decreases in blood glucose (hypoglycemia), especially if you use insulin or take certain medicines for diabetes mellitus. Hypoglycemia can be a life-threatening condition. Symptoms of hypoglycemia (sleepiness, dizziness, and disorientation) are similar to symptoms of having too much alcohol.  If your health care provider has given you approval to drink alcohol, do so in moderation and use the following guidelines:  Women should not have more than one drink per day, and men should not have more than two drinks per day. One drink is equal to:  12 oz of beer.  5 oz of wine.  1 oz of hard liquor.  Do not drink on an empty stomach.  Keep yourself hydrated. Have water, diet soda, or unsweetened iced tea.  Regular soda, juice, and other mixers might contain a lot of carbohydrates and should be counted. WHAT FOODS ARE NOT RECOMMENDED? As you make food choices, it is important to remember that all foods are not the same. Some foods have fewer nutrients per serving than other  foods, even though they might have the same number of calories or carbohydrates. It is difficult to get your body what it needs when you eat foods with fewer nutrients. Examples of foods that you should avoid that are high in calories and carbohydrates but low in nutrients include:  Trans fats (most processed foods list trans fats on the Nutrition Facts label).  Regular soda.  Juice.  Candy.  Sweets, such as cake, pie, doughnuts, and cookies.  Fried foods. WHAT FOODS CAN I EAT? Have nutrient-rich foods, which will nourish your body and keep you healthy. The food you should eat also will depend on several factors, including:  The calories you need.  The medicines you take.  Your weight.  Your blood glucose level.  Your blood pressure level.  Your cholesterol level. You also should eat a variety of foods, including:  Protein, such as meat, poultry, fish, tofu, nuts, and seeds (lean animal proteins are best).  Fruits.  Vegetables.  Dairy products, such as milk, cheese, and yogurt (low fat is best).  Breads, grains, pasta, cereal, rice, and beans.  Fats such as olive oil, trans fat-free margarine, canola oil, avocado, and olives. DOES EVERYONE WITH DIABETES MELLITUS HAVE THE SAME MEAL PLAN? Because every person with diabetes mellitus is different, there is not one meal plan that works for everyone. It is very important that you meet with a dietitian who will help you create a meal plan that is just right for you. Document Released: 01/15/2005 Document Revised: 04/25/2013 Document Reviewed: 03/17/2013 [employer] Patient Information 2015 [employer]. This   information is not intended to replace advice given to you by your health care provider. Make sure you discuss any questions you have with your health care provider. Fat and Cholesterol Control Diet Fat and cholesterol levels in your blood and organs are influenced by your diet. High levels of fat and cholesterol may lead to  diseases of the heart, small and large blood vessels, gallbladder, liver, and pancreas. CONTROLLING FAT AND CHOLESTEROL WITH DIET Although exercise and lifestyle factors are important, your diet is key. That is because certain foods are known to raise cholesterol and others to lower it. The goal is to balance foods for their effect on cholesterol and more importantly, to replace saturated and trans fat with other types of fat, such as monounsaturated fat, polyunsaturated fat, and omega-3 fatty acids. On average, a person should consume no more than 15 to 17 g of saturated fat daily. Saturated and trans fats are considered "bad" fats, and they will raise LDL cholesterol. Saturated fats are primarily found in animal products such as meats, butter, and cream. However, that does not mean you need to give up all your favorite foods. Today, there are good tasting, low-fat, low-cholesterol substitutes for most of the things you like to eat. Choose low-fat or nonfat alternatives. Choose round or loin cuts of red meat. These types of cuts are lowest in fat and cholesterol. Chicken (without the skin), fish, veal, and ground turkey breast are great choices. Eliminate fatty meats, such as hot dogs and salami. Even shellfish have little or no saturated fat. Have a 3 oz (85 g) portion when you eat lean meat, poultry, or fish. Trans fats are also called "partially hydrogenated oils." They are oils that have been scientifically manipulated so that they are solid at room temperature resulting in a longer shelf life and improved taste and texture of foods in which they are added. Trans fats are found in stick margarine, some tub margarines, cookies, crackers, and baked goods.  When baking and cooking, oils are a great substitute for butter. The monounsaturated oils are especially beneficial since it is believed they lower LDL and raise HDL. The oils you should avoid entirely are saturated tropical oils, such as coconut and palm.   Remember to eat a lot from food groups that are naturally free of saturated and trans fat, including fish, fruit, vegetables, beans, grains (barley, rice, couscous, bulgur wheat), and pasta (without cream sauces).  IDENTIFYING FOODS THAT LOWER FAT AND CHOLESTEROL  Soluble fiber may lower your cholesterol. This type of fiber is found in fruits such as apples, vegetables such as broccoli, potatoes, and carrots, legumes such as beans, peas, and lentils, and grains such as barley. Foods fortified with plant sterols (phytosterol) may also lower cholesterol. You should eat at least 2 g per day of these foods for a cholesterol lowering effect.  Read package labels to identify low-saturated fats, trans fat free, and low-fat foods at the supermarket. Select cheeses that have only 2 to 3 g saturated fat per ounce. Use a heart-healthy tub margarine that is free of trans fats or partially hydrogenated oil. When buying baked goods (cookies, crackers), avoid partially hydrogenated oils. Breads and muffins should be made from whole grains (whole-wheat or whole oat flour, instead of "flour" or "enriched flour"). Buy non-creamy canned soups with reduced salt and no added fats.  FOOD PREPARATION TECHNIQUES  Never deep-fry. If you must fry, either stir-fry, which uses very little fat, or use non-stick cooking sprays. When possible, broil,   bake, or roast meats, and steam vegetables. Instead of putting butter or margarine on vegetables, use lemon and herbs, applesauce, and cinnamon (for squash and sweet potatoes). Use nonfat yogurt, salsa, and low-fat dressings for salads.  LOW-SATURATED FAT / LOW-FAT FOOD SUBSTITUTES Meats / Saturated Fat (g)  Avoid: Steak, marbled (3 oz/85 g) / 11 g  Choose: Steak, lean (3 oz/85 g) / 4 g  Avoid: Hamburger (3 oz/85 g) / 7 g  Choose: Hamburger, lean (3 oz/85 g) / 5 g  Avoid: Ham (3 oz/85 g) / 6 g  Choose: Ham, lean cut (3 oz/85 g) / 2.4 g  Avoid: Chicken, with skin, dark meat (3  oz/85 g) / 4 g  Choose: Chicken, skin removed, dark meat (3 oz/85 g) / 2 g  Avoid: Chicken, with skin, light meat (3 oz/85 g) / 2.5 g  Choose: Chicken, skin removed, light meat (3 oz/85 g) / 1 g Dairy / Saturated Fat (g)  Avoid: Whole milk (1 cup) / 5 g  Choose: Low-fat milk, 2% (1 cup) / 3 g  Choose: Low-fat milk, 1% (1 cup) / 1.5 g  Choose: Skim milk (1 cup) / 0.3 g  Avoid: Hard cheese (1 oz/28 g) / 6 g  Choose: Skim milk cheese (1 oz/28 g) / 2 to 3 g  Avoid: Cottage cheese, 4% fat (1 cup) / 6.5 g  Choose: Low-fat cottage cheese, 1% fat (1 cup) / 1.5 g  Avoid: Ice cream (1 cup) / 9 g  Choose: Sherbet (1 cup) / 2.5 g  Choose: Nonfat frozen yogurt (1 cup) / 0.3 g  Choose: Frozen fruit bar / trace  Avoid: Whipped cream (1 tbs) / 3.5 g  Choose: Nondairy whipped topping (1 tbs) / 1 g Condiments / Saturated Fat (g)  Avoid: Mayonnaise (1 tbs) / 2 g  Choose: Low-fat mayonnaise (1 tbs) / 1 g  Avoid: Butter (1 tbs) / 7 g  Choose: Extra light margarine (1 tbs) / 1 g  Avoid: Coconut oil (1 tbs) / 11.8 g  Choose: Olive oil (1 tbs) / 1.8 g  Choose: Corn oil (1 tbs) / 1.7 g  Choose: Safflower oil (1 tbs) / 1.2 g  Choose: Sunflower oil (1 tbs) / 1.4 g  Choose: Soybean oil (1 tbs) / 2.4 g  Choose: Canola oil (1 tbs) / 1 g Document Released: 04/20/2005 Document Revised: 08/15/2012 Document Reviewed: 07/19/2013 Exit[employer] Patient Information 2015 [employer]. This information is not intended to replace advice given to you by your health care provider. Make sure you discuss any questions you have with your health care provider.  

## 2014-05-29 NOTE — Progress Notes (Signed)
MRN: [id number] Name: Justin Wilkinson  Sex: male Age: 34 y.o. DOB: 1980-11-02  Allergies: Fish allergy and Shellfish allergy  Chief Complaint  Patient presents with  . Follow-up    HPI: Patient is 34 y.o. male who has been following up with endocrinologist for type 1 diabetes, history of hypertension hyperlipidemia comes today for followup, requesting refill on his medications, denies any hypoglycemic symptoms his hemoglobin A1c noticed to have slightly improved, as per patient he was told by his endocrinologist take insulin one time, already has scheduled appointment next month, previous blood work reviewed her his LDL is still elevated currently he's taking Lipitor 10 mg daily  Past Medical History  Diagnosis Date  . Diabetes mellitus without complication     History reviewed. No pertinent past surgical history.    Medication List       This list is accurate as of: 05/29/14 12:14 PM.  Always use your most recent med list.               Alcohol Swabs Pads  1 Container by Does not apply route 2 (two) times daily.     atorvastatin 20 MG tablet  Commonly known as:  LIPITOR  Take 1 tablet (20 mg total) by mouth daily.     FINGERSTIX LANCETS Misc  1 Container by Does not apply route 2 (two) times daily.     glucose blood test strip  Commonly known as:  ACCU-CHEK SMARTVIEW  1 each by Other route 2 (two) times daily. And lancets 2/day     insulin NPH-regular Human (70-30) 100 UNIT/ML injection  Commonly known as:  NOVOLIN 70/30  20 units with breakfast, and 10 units with the evening meal.     lisinopril 10 MG tablet  Commonly known as:  PRINIVIL,ZESTRIL  Take 1 tablet (10 mg total) by mouth daily.     multivitamin with minerals Tabs tablet  Take 1 tablet by mouth daily.     pantoprazole 40 MG tablet  Commonly known as:  PROTONIX  Take 1 tablet (40 mg total) by mouth daily.        Meds ordered this encounter  Medications  . atorvastatin (LIPITOR) 20 MG  tablet    Sig: Take 1 tablet (20 mg total) by mouth daily.    Dispense:  30 tablet    Refill:  3  . FINGERSTIX LANCETS MISC    Sig: 1 Container by Does not apply route 2 (two) times daily.    Dispense:  200 each    Refill:  2  . glucose blood (ACCU-CHEK SMARTVIEW) test strip    Sig: 1 each by Other route 2 (two) times daily. And lancets 2/day    Dispense:  100 each    Refill:  12  . insulin NPH-regular Human (NOVOLIN 70/30) (70-30) 100 UNIT/ML injection    Sig: 20 units with breakfast, and 10 units with the evening meal.    Dispense:  10 mL    Refill:  1  . lisinopril (PRINIVIL,ZESTRIL) 10 MG tablet    Sig: Take 1 tablet (10 mg total) by mouth daily.    Dispense:  90 tablet    Refill:  3  . pantoprazole (PROTONIX) 40 MG tablet    Sig: Take 1 tablet (40 mg total) by mouth daily.    Dispense:  30 tablet    Refill:  3    Immunization History  Administered Date(s) Administered  . Influenza,inj,Quad PF,36+ Mos 03/24/2013, 02/09/2014  .  Pneumococcal Polysaccharide-23 03/24/2013    Family History  Problem Relation Age of Onset  . Lung cancer Father   . Diabetes Other     History  Substance Use Topics  . Smoking status: Former Smoker -- 0.50 packs/day    Types: Cigarettes    Quit date: 12/15/2012  . Smokeless tobacco: Never Used  . Alcohol Use: No    Review of Systems   As noted in HPI  Filed Vitals:   05/29/14 1148  BP: 113/75  Pulse: 94  Temp: 98 F (36.7 C)  Resp: 16    Physical Exam  Physical Exam  Constitutional: No distress.  Eyes: EOM are normal. Pupils are equal, round, and reactive to light.  Cardiovascular: Normal rate and regular rhythm.   Pulmonary/Chest: Breath sounds normal. No respiratory distress. He has no wheezes. He has no rales.  Musculoskeletal: He exhibits no edema.    CBC    Component Value Date/Time   WBC 9.9 12/19/2012 0513   RBC 4.30 12/19/2012 0513   HGB 12.9* 12/19/2012 0513   HCT 37.5* 12/19/2012 0513   PLT 182  12/19/2012 0513   MCV 87.2 12/19/2012 0513   LYMPHSABS 1.3 12/15/2012 1130   MONOABS 1.8* 12/15/2012 1130   EOSABS 0.0 12/15/2012 1130   BASOSABS 0.0 12/15/2012 1130    CMP     Component Value Date/Time   NA 136 02/09/2014 1141   K 4.2 02/09/2014 1141   CL 96 02/09/2014 1141   CO2 21 02/09/2014 1141   GLUCOSE 265* 02/09/2014 1141   BUN 13 02/09/2014 1141   CREATININE 0.96 02/09/2014 1141   CREATININE 1.1 03/24/2013 1203   CALCIUM 9.8 02/09/2014 1141   PROT 7.5 02/09/2014 1141   ALBUMIN 4.2 02/09/2014 1141   AST 10 02/09/2014 1141   ALT 10 02/09/2014 1141   ALKPHOS 61 02/09/2014 1141   BILITOT 0.5 02/09/2014 1141   GFRNONAA >89 02/09/2014 1141   GFRNONAA >90 12/19/2012 0500   GFRAA >89 02/09/2014 1141   GFRAA >90 12/19/2012 0500    Lab Results  Component Value Date/Time   CHOL 198 02/09/2014 11:41 AM    No components found for: HGA1C  Lab Results  Component Value Date/Time   AST 10 02/09/2014 11:41 AM    Assessment and Plan  Other specified diabetes mellitus without complications - Plan: Results for orders placed or performed in visit on 05/29/14  Glucose (CBG)  Result Value Ref Range   POC Glucose 78.0 70 - 99 mg/dl  HgB Z6X  Result Value Ref Range   Hemoglobin A1C 14.20    Diabetes is still  uncontrolled , patient will follow up with this endocrinologist for optimization, he is given refill on the meds Glucose (CBG), HgB A1c, FINGERSTIX LANCETS MISC, glucose blood (ACCU-CHEK SMARTVIEW) test strip, insulin NPH-regular Human (NOVOLIN 70/30) (70-30) 100 UNIT/ML injection  Dyslipidemia - Plan:I have increased the dose of Lipitor  atorvastatin (LIPITOR) 20 MG tablet, will recheck Lipid pane ltoday and in 3 months.  Essential hypertension, benign - Plan:blood pressure is well controlled her, continue with  lisinopril (PRINIVIL,ZESTRIL) 10 MG tablet, COMPLETE METABOLIC PANEL WITH GFR   Health Maintenance  -Vaccinations:  uptodate with flu shot and pneumovax      Return in about 4 months (around 09/27/2014) for hypertension, diabetes, hyperipidemia.  Doris Cheadle, MD

## 2014-05-31 ENCOUNTER — Telehealth: Payer: Self-pay | Admitting: *Deleted

## 2014-05-31 DIAGNOSIS — E785 Hyperlipidemia, unspecified: Secondary | ICD-10-CM

## 2014-05-31 MED ORDER — ATORVASTATIN CALCIUM 40 MG PO TABS: 40.0000 mg | ORAL_TABLET | Freq: Every day | ORAL | Status: DC

## 2014-05-31 NOTE — Telephone Encounter (Signed)
Pt aware of lab results and medicine changes New Rx was send to [facility] pharmacy

## 2014-05-31 NOTE — Telephone Encounter (Signed)
-----   Message from Doris Cheadle, MD sent at 05/30/2014  9:09 AM EST ----- Call and let the patient know that his lipid panel shows elevated LDL, advise patient to increase the dose of Lipitor to 40 mg, also advise for low fat diet. Will repeat fasting lipid panel on the next visit.

## 2014-06-14 ENCOUNTER — Ambulatory Visit (INDEPENDENT_AMBULATORY_CARE_PROVIDER_SITE_OTHER): Payer: Medicaid Other | Admitting: Endocrinology

## 2014-06-14 ENCOUNTER — Encounter: Payer: Self-pay | Admitting: Endocrinology

## 2014-06-14 VITALS — BP 104/64 | HR 78 | Temp 97.7°F | Ht 69.0 in | Wt 205.0 lb

## 2014-06-14 DIAGNOSIS — E108 Type 1 diabetes mellitus with unspecified complications: Principal | ICD-10-CM

## 2014-06-14 DIAGNOSIS — E1065 Type 1 diabetes mellitus with hyperglycemia: Secondary | ICD-10-CM

## 2014-06-14 DIAGNOSIS — E1069 Type 1 diabetes mellitus with other specified complication: Secondary | ICD-10-CM

## 2014-06-14 DIAGNOSIS — IMO0002 Reserved for concepts with insufficient information to code with codable children: Secondary | ICD-10-CM

## 2014-06-14 MED ORDER — INSULIN GLARGINE 100 UNIT/ML SOLOSTAR PEN: 40.0000 [IU] | PEN_INJECTOR | SUBCUTANEOUS | Status: DC

## 2014-06-14 NOTE — Progress Notes (Signed)
Subjective:    Patient ID: Justin Wilkinson, male    DOB: 1980/08/17, 34 y.o.   MRN: [id number]  HPI Pt returns for f/u of diabetes mellitus: DM type: 1 Dx'ed: 2014 Complications: none. Therapy: insulin since dx. DKA: only at dx. Severe hypoglycemia: never Pancreatitis: never Other: he is willing to take insulin only as often as qd Interval history: He resumed insulin in august of 2015.  no cbg record, but states cbg's are well-controlled. no cbg record, but states cbg's are in the low-100's.  He says he never misses the insulin.  He takes novolin 70/30, 45 units qd.  He says he takes it at hs.   Past Medical History  Diagnosis Date  . Diabetes mellitus without complication     No past surgical history on file.  History   Social History  . Marital Status: Single    Spouse Name: N/A  . Number of Children: N/A  . Years of Education: N/A   Occupational History  . Not on file.   Social History Main Topics  . Smoking status: Former Smoker -- 0.50 packs/day    Types: Cigarettes    Quit date: 12/15/2012  . Smokeless tobacco: Never Used  . Alcohol Use: No  . Drug Use: No  . Sexual Activity: No   Other Topics Concern  . Not on file   Social History Narrative    Current Outpatient Prescriptions on File Prior to Visit  Medication Sig Dispense Refill  . Alcohol Swabs PADS 1 Container by Does not apply route 2 (two) times daily. 100 each 12  . atorvastatin (LIPITOR) 40 MG tablet Take 1 tablet (40 mg total) by mouth daily. 30 tablet 3  . FINGERSTIX LANCETS MISC 1 Container by Does not apply route 2 (two) times daily. 200 each 2  . glucose blood (ACCU-CHEK SMARTVIEW) test strip 1 each by Other route 2 (two) times daily. And lancets 2/day 100 each 12  . lisinopril (PRINIVIL,ZESTRIL) 10 MG tablet Take 1 tablet (10 mg total) by mouth daily. 90 tablet 3  . Multiple Vitamin (MULTIVITAMIN WITH MINERALS) TABS tablet Take 1 tablet by mouth daily.    . pantoprazole (PROTONIX) 40 MG  tablet Take 1 tablet (40 mg total) by mouth daily. 30 tablet 3   No current facility-administered medications on file prior to visit.    Allergies  Allergen Reactions  . Fish Allergy Anaphylaxis  . Shellfish Allergy Anaphylaxis    Family History  Problem Relation Age of Onset  . Lung cancer Father   . Diabetes Other     BP 104/64 mmHg  Pulse 78  Temp(Src) 97.7 F (36.5 C) (Oral)  Ht  (1.753 m)  Wt 205 lb (92.987 kg)  BMI 30.26 kg/m2  SpO2 95%  Review of Systems He denies hypoglycemia and weight change.    Objective:   Physical Exam VITAL SIGNS:  See vs page. GENERAL: no distress.   Pulses: dorsalis pedis intact bilat.   MSK: no deformity of the feet. CV: no leg edema. Skin:  no ulcer on the feet.  normal color and temp on the feet. Neuro: sensation is intact to touch on the feet.    Lab Results  Component Value Date   HGBA1C 14.20 05/29/2014      Assessment & Plan:  DM: severe exacerbation.   Noncompliance with cbg recording and insulin dosing: I'll work around this as best I can.  He needs the simplest possible insulin regimen, and achievable goals.  Patient is advised the following: Patient Instructions  check your blood sugar twice a day: before the 3 meals, and at bedtime.  also check if you have symptoms of your blood sugar being too high or too low.  please keep a record of the readings and bring it to your next appointment here.  You can write it on any piece of paper.  please call us sooner if your blood sugar goes below 70, or if you have a lot of readings over 200.   i have sent a prescription to your pharmacy, to change the insulin to lantus.   Please come back for a follow-up appointment in 2-3 weeks.

## 2014-06-14 NOTE — Patient Instructions (Addendum)
check your blood sugar twice a day: before the 3 meals, and at bedtime.  also check if you have symptoms of your blood sugar being too high or too low.  please keep a record of the readings and bring it to your next appointment here.  You can write it on any piece of paper.  please call us sooner if your blood sugar goes below 70, or if you have a lot of readings over 200.   i have sent a prescription to your pharmacy, to change the insulin to lantus.   Please come back for a follow-up appointment in 2-3 weeks.

## 2014-06-26 NOTE — Telephone Encounter (Signed)
Open in error

## 2014-07-05 ENCOUNTER — Encounter: Payer: Self-pay | Admitting: Endocrinology

## 2014-07-05 ENCOUNTER — Ambulatory Visit (INDEPENDENT_AMBULATORY_CARE_PROVIDER_SITE_OTHER): Payer: Medicaid Other | Admitting: Endocrinology

## 2014-07-05 VITALS — BP 120/96 | HR 132 | Temp 98.6°F | Ht 69.0 in | Wt 199.0 lb

## 2014-07-05 DIAGNOSIS — E1069 Type 1 diabetes mellitus with other specified complication: Secondary | ICD-10-CM

## 2014-07-05 DIAGNOSIS — IMO0002 Reserved for concepts with insufficient information to code with codable children: Secondary | ICD-10-CM

## 2014-07-05 DIAGNOSIS — E108 Type 1 diabetes mellitus with unspecified complications: Principal | ICD-10-CM

## 2014-07-05 DIAGNOSIS — E1065 Type 1 diabetes mellitus with hyperglycemia: Secondary | ICD-10-CM

## 2014-07-05 MED ORDER — INSULIN GLARGINE 100 UNIT/ML SOLOSTAR PEN: 40.0000 [IU] | PEN_INJECTOR | SUBCUTANEOUS | Status: DC

## 2014-07-05 NOTE — Progress Notes (Signed)
Subjective:    Patient ID: Justin Wilkinson, male    DOB: 03-20-1981, 34 y.o.   MRN: [id number]  HPI Pt returns for f/u of diabetes mellitus: DM type: 1 Dx'ed: 2014 Complications: none. Therapy: insulin since dx. DKA: only at dx. Severe hypoglycemia: never Pancreatitis: never Other: he is willing to take insulin only as often as qd.   Interval history: Pt says he has not taken any insulin x 1 week, since he ran out of previous insulin.  He has lantus, but has not started taking it.  He denies n/v/sob Past Medical History  Diagnosis Date  . Diabetes mellitus without complication     No past surgical history on file.  History   Social History  . Marital Status: Single    Spouse Name: N/A  . Number of Children: N/A  . Years of Education: N/A   Occupational History  . Not on file.   Social History Main Topics  . Smoking status: Former Smoker -- 0.50 packs/day    Types: Cigarettes    Quit date: 12/15/2012  . Smokeless tobacco: Never Used  . Alcohol Use: No  . Drug Use: No  . Sexual Activity: No   Other Topics Concern  . Not on file   Social History Narrative    Current Outpatient Prescriptions on File Prior to Visit  Medication Sig Dispense Refill  . Alcohol Swabs PADS 1 Container by Does not apply route 2 (two) times daily. 100 each 12  . atorvastatin (LIPITOR) 40 MG tablet Take 1 tablet (40 mg total) by mouth daily. 30 tablet 3  . FINGERSTIX LANCETS MISC 1 Container by Does not apply route 2 (two) times daily. 200 each 2  . glucose blood (ACCU-CHEK SMARTVIEW) test strip 1 each by Other route 2 (two) times daily. And lancets 2/day 100 each 12  . lisinopril (PRINIVIL,ZESTRIL) 10 MG tablet Take 1 tablet (10 mg total) by mouth daily. 90 tablet 3  . Multiple Vitamin (MULTIVITAMIN WITH MINERALS) TABS tablet Take 1 tablet by mouth daily.    . pantoprazole (PROTONIX) 40 MG tablet Take 1 tablet (40 mg total) by mouth daily. 30 tablet 3   No current facility-administered  medications on file prior to visit.    Allergies  Allergen Reactions  . Fish Allergy Anaphylaxis  . Shellfish Allergy Anaphylaxis    Family History  Problem Relation Age of Onset  . Lung cancer Father   . Diabetes Other     BP 120/96 mmHg  Pulse 132  Temp(Src) 98.6 F (37 C) (Oral)  Ht  (1.753 m)  Wt 199 lb (90.266 kg)  BMI 29.37 kg/m2  SpO2 96%  Review of Systems He has lost a few lbs.  He denies numbness.    Objective:   Physical Exam VITAL SIGNS:  See vs page GENERAL: no distress Pulses: dorsalis pedis intact bilat.   MSK: no deformity of the feet CV: no leg edema Skin:  no ulcer on the feet.  normal color and temp on the feet. Neuro: sensation is intact to touch on the feet   Lab Results  Component Value Date   HGBA1C 14.20 05/29/2014      Assessment & Plan:  DM: severe exacerbation.  No clinical evidence of DKA. Noncompliance with cbg recording and insulin: I'll work around this as best I can Weight loss, prob due to poorly-controlled DM.  We'll follow.    Patient is advised the following: Patient Instructions  check your blood sugar  twice a day: before the 3 meals, and at bedtime.  also check if you have symptoms of your blood sugar being too high or too low.  please keep a record of the readings and bring it to your next appointment here.  You can write it on any piece of paper.  please call us sooner if your blood sugar goes below 70, or if you have a lot of readings over 200.   Please start taking the lantus today.  It is really dangerous to miss it.  Please come back for a follow-up appointment in 2-3 weeks.

## 2014-07-05 NOTE — Patient Instructions (Addendum)
check your blood sugar twice a day: before the 3 meals, and at bedtime.  also check if you have symptoms of your blood sugar being too high or too low.  please keep a record of the readings and bring it to your next appointment here.  You can write it on any piece of paper.  please call us sooner if your blood sugar goes below 70, or if you have a lot of readings over 200.   Please start taking the lantus today.  It is really dangerous to miss it.  Please come back for a follow-up appointment in 2-3 weeks.

## 2014-07-26 ENCOUNTER — Encounter: Payer: Self-pay | Admitting: Endocrinology

## 2014-07-26 ENCOUNTER — Ambulatory Visit (INDEPENDENT_AMBULATORY_CARE_PROVIDER_SITE_OTHER): Payer: Medicaid Other | Admitting: Endocrinology

## 2014-07-26 VITALS — BP 122/86 | HR 70 | Temp 97.8°F | Wt 201.0 lb

## 2014-07-26 DIAGNOSIS — E109 Type 1 diabetes mellitus without complications: Secondary | ICD-10-CM

## 2014-07-26 NOTE — Progress Notes (Signed)
Subjective:    Patient ID: Justin Wilkinson, male    DOB: 08-21-1980, 34 y.o.   MRN: [id number]  HPI Pt returns for f/u of diabetes mellitus: DM type: 1 Dx'ed: 2014 Complications: none. Therapy: insulin since dx. DKA: only at dx. Severe hypoglycemia: never Pancreatitis: never Other: he is willing to take insulin only as often as qd.   Interval history: since back on insulin, cbg's vary from 89-130.  It is in general higher as the day goes on, but not necessarily so.  pt states he feels no different, and well in general. Past Medical History  Diagnosis Date  . Diabetes mellitus without complication     No past surgical history on file.  History   Social History  . Marital Status: Single    Spouse Name: N/A  . Number of Children: N/A  . Years of Education: N/A   Occupational History  . Not on file.   Social History Main Topics  . Smoking status: Former Smoker -- 0.50 packs/day    Types: Cigarettes    Quit date: 12/15/2012  . Smokeless tobacco: Never Used  . Alcohol Use: No  . Drug Use: No  . Sexual Activity: No   Other Topics Concern  . Not on file   Social History Narrative    Current Outpatient Prescriptions on File Prior to Visit  Medication Sig Dispense Refill  . Alcohol Swabs PADS 1 Container by Does not apply route 2 (two) times daily. 100 each 12  . atorvastatin (LIPITOR) 40 MG tablet Take 1 tablet (40 mg total) by mouth daily. 30 tablet 3  . FINGERSTIX LANCETS MISC 1 Container by Does not apply route 2 (two) times daily. 200 each 2  . glucose blood (ACCU-CHEK SMARTVIEW) test strip 1 each by Other route 2 (two) times daily. And lancets 2/day 100 each 12  . Insulin Glargine (LANTUS SOLOSTAR) 100 UNIT/ML Solostar Pen Inject 40 Units into the skin every morning. And pen needles 1/day 20 mL 11  . lisinopril (PRINIVIL,ZESTRIL) 10 MG tablet Take 1 tablet (10 mg total) by mouth daily. 90 tablet 3  . Multiple Vitamin (MULTIVITAMIN WITH MINERALS) TABS tablet Take 1  tablet by mouth daily.    . pantoprazole (PROTONIX) 40 MG tablet Take 1 tablet (40 mg total) by mouth daily. 30 tablet 3   No current facility-administered medications on file prior to visit.    Allergies  Allergen Reactions  . Fish Allergy Anaphylaxis  . Shellfish Allergy Anaphylaxis    Family History  Problem Relation Age of Onset  . Lung cancer Father   . Diabetes Maternal Grandmother     BP 122/86 mmHg  Pulse 70  Temp(Src) 97.8 F (36.6 C) (Oral)  Wt 201 lb (91.173 kg)  SpO2 97%   Review of Systems He denies hypoglycemia.       Objective:   Physical Exam VITAL SIGNS:  See vs page GENERAL: no distress SKIN:  Insulin injection sites at the anterior abdomen are normal      Assessment & Plan:  DM: glycemia control is apparently good, back on insulin.    Patient is advised the following: Patient Instructions  check your blood sugar twice a day: before the 3 meals, and at bedtime.  also check if you have symptoms of your blood sugar being too high or too low.  please keep a record of the readings and bring it to your next appointment here.  You can write it on any piece of  paper.  please call us sooner if your blood sugar goes below 70, or if you have a lot of readings over 200.   Please continue the same lantus.   Please come back for a follow-up appointment in 2 months.      Type 1 Diabetes Mellitus Type 1 diabetes mellitus, often simply referred to as diabetes, is a long-term (chronic) disease. It occurs when the islet cells in the pancreas that make insulin (a hormone) are destroyed and can no longer make insulin. Insulin is needed to move sugars from food into the tissue cells. The tissue cells use the sugars for energy. In people with type 1 diabetes, the sugars build up in the blood instead of going into the tissue cells. As a result, high blood sugar (hyperglycemia) develops. Without insulin, the body breaks down fat cells for the needed energy. This breakdown  of fat cells produces acid chemicals (ketones), which increases the acid levels in the body. The effect of either high ketone or high sugar (glucose) levels can be life-threatening.  Type 1 diabetes was also previously called juvenile diabetes. It most often occurs before the age of 75, but it can occur at any age. RISK FACTORS A person is predisposed to developing type 1 diabetes if someone in his or her family has the disease and is exposed to certain additional environmental triggers.  SYMPTOMS  Symptoms of type 1 diabetes may develop gradually over days to weeks or suddenly. The symptoms occur due to hyperglycemia. The symptoms can include:   Increased thirst (polydipsia).  Increased urination (polyuria).  Increased urination during the night (nocturia).  Weight loss. This weight loss may be rapid.  Frequent, recurring infections.  Tiredness (fatigue).  Weakness.  Vision changes, such as blurred vision.  Fruity smell to your breath.  Abdominal pain.  Nausea or vomiting. DIAGNOSIS  Type 1 diabetes is diagnosed when symptoms of diabetes are present and when blood glucose levels are increased. Your blood glucose level may be checked by one or more of the following blood tests:  A fasting blood glucose test. You will not be allowed to eat for at least 8 hours before a blood sample is taken.  A random blood glucose test. Your blood glucose is checked at any time of the day regardless of when you ate.  A hemoglobin A1c blood glucose test. A hemoglobin A1c test provides information about blood glucose control over the previous 3 months. TREATMENT  Although type 1 diabetes cannot be prevented, it can be managed with insulin, diet, and exercise.  You will need to take insulin daily to keep blood glucose in the desired range.  You will need to match insulin dosing with exercise and healthy food choices. The treatment goal is to maintain the before-meal blood sugar (preprandial  glucose) level at 70-130 mg/dL.  HOME CARE INSTRUCTIONS   Have your hemoglobin A1c level checked twice a year.  Perform daily blood glucose monitoring as directed by your health care provider.  Monitor urine ketones when you are ill and as directed by your health care provider.  Take your insulin as directed by your health care provider to maintain your blood glucose level in the desired range.  Never run out of insulin. It is needed every day.  Adjust insulin based on your intake of carbohydrates. Carbohydrates can raise blood glucose levels but need to be included in your diet. Carbohydrates provide vitamins, minerals, and fiber, which are an essential part of a healthy diet. Carbohydrates  are found in fruits, vegetables, whole grains, dairy products, legumes, and foods containing added sugars.  Eat healthy foods. Alternate 3 meals with 3 snacks.  Maintain a healthy weight.  Carry a medical alert card or wear your medical alert jewelry.  Carry a 15-gram carbohydrate snack with you at all times to treat low blood glucose (hypoglycemia). Some examples of 15-gram carbohydrate snacks include:  Glucose tablets, 3 or 4.  Glucose gel, 15-gram tube.  Raisins, 2 tablespoons (24 grams).  Jelly beans, 6.  Animal crackers, 8.  Fruit juice, regular soda, or low-fat milk, 4 ounces (120 mL).  Gummy treats, 9.  Recognize hypoglycemia. Hypoglycemia occurs with blood glucose levels of 70 mg/dL and below. The risk for hypoglycemia increases when fasting or skipping meals, during or after intense exercise, and during sleep. Hypoglycemia symptoms can include:  Tremors or shakes.  Decreased ability to concentrate.  Sweating.  Increased heart rate.  Headache.  Dry mouth.  Hunger.  Irritability.  Anxiety.  Restless sleep.  Altered speech or coordination.  Confusion.  Treat hypoglycemia promptly. If you are alert and able to safely swallow, follow the 15:15 rule:  Take  15-20 grams of rapid-acting glucose or carbohydrate. Rapid-acting options include glucose gel, glucose tablets, or 4 ounces (120 mL) of fruit juice, regular soda, or low-fat milk.  Check your blood glucose level 15 minutes after taking the glucose.  Take 15-20 grams more of glucose if the repeat blood glucose level is still 70 mg/dL or below.  Eat a meal or snack within 1 hour once blood glucose levels return to normal.  Be alert to polyuria and polydipsia, which are early signs of hyperglycemia. An early awareness of hyperglycemia allows for prompt treatment. Treat hyperglycemia as directed by your health care provider.  Exercise regularly as directed by your health care provider. This includes:  Performing resistance training twice a week such as push-ups, sit-ups, lifting weights, or using resistance bands.  Performing 150 minutes of cardio exercises each week such as walking, running, or playing sports.  Staying active and spending no more than 90 minutes at one time being inactive.  Adjust your insulin dosing and food intake as needed if you start a new exercise or sport.  Follow your sick-day plan at any time you are unable to eat or drink as usual.   Do not use any tobacco products including cigarettes, chewing tobacco, or electronic cigarettes. If you need help quitting, ask your health care provider.  Limit alcohol intake to no more than 1 drink per day for nonpregnant women and 2 drinks per day for men. You should drink alcohol only when you are also eating food. Talk with your health care provider about whether alcohol is safe for you. Tell your health care provider if you drink alcohol several times a week.  Keep all follow-up visits as directed by your health care provider.  Schedule an eye exam within 5 years of diagnosis and then annually.  Perform daily skin and foot care. Examine your skin and feet daily for cuts, bruises, redness, nail problems, bleeding, blisters, or  sores. A foot exam by a health care provider should be done annually.  Brush your teeth and gums at least twice a day and floss at least once a day. Follow up with your dentist regularly.  Share your diabetes management plan with your workplace or school.  Stay up-to-date with immunizations. It is recommended that people with diabetes who are over 22 years old get the pneumonia  vaccine. In some cases, two separate shots may be given. Ask your health care provider if your pneumonia vaccination is up-to-date.  Learn to manage stress.  Obtain ongoing diabetes education and support as needed.  Participate in or seek rehabilitation as needed to maintain or improve independence and quality of life. Request a physical or occupational therapy referral if you are having foot or hand numbness, or difficulties with grooming, dressing, eating, or physical activity. SEEK MEDICAL CARE IF:   You are unable to eat food or drink fluids for more than 6 hours.  You have nausea and vomiting for more than 6 hours.  Your blood glucose level is over 240 mg/dL.  There is a change in mental status.  You develop an additional serious illness.  You have diarrhea for more than 6 hours.  You have been sick or have had a fever for a couple of days and are not getting better.  You have pain during any physical activity. SEEK IMMEDIATE MEDICAL CARE IF:  You have difficulty breathing.  You have moderate to large ketone levels. MAKE SURE YOU:  Understand these instructions.  Will watch your condition.  Will get help right away if you are not doing well or get worse. Document Released: 04/17/2000 Document Revised: 09/04/2013 Document Reviewed: 11/17/2011 [facility] Patient Information 2015 [place], [place]. This information is not intended to replace advice given to you by your health care provider. Make sure you discuss any questions you have with your health care provider. Low Blood Sugar Low blood sugar  (hypoglycemia) means that the level of sugar in your blood is lower than it should be. Signs of low blood sugar include:  Getting sweaty.  Feeling hungry.  Feeling dizzy or weak.  Feeling sleepier than normal.  Feeling nervous.  Headaches.  Having a fast heartbeat. Low blood sugar can happen fast and can be an emergency. Your doctor can do tests to check your blood sugar level. You can have low blood sugar and not have diabetes. HOME CARE  Check your blood sugar as told by your doctor. If it is less than 70 mg/dl or as told by your doctor, take 1 of the following:  3 to 4 glucose tablets.   cup clear juice.   cup soda pop, not diet.  1 cup milk.  5 to 6 hard candies.  Recheck blood sugar after 15 minutes. Repeat until it is at the right level.  Eat a snack if it is more than 1 hour until the next meal.  Only take medicine as told by your doctor.  Do not skip meals. Eat on time.  Do not drink alcohol except with meals.  Check your blood glucose before driving.  Check your blood glucose before and after exercise.  Always carry treatment with you, such as glucose pills.  Always wear a medical alert bracelet if you have diabetes. GET HELP RIGHT AWAY IF:   Your blood glucose goes below 70 mg/dl or as told by your doctor, and you:  Are confused.  Are not able to swallow.  Pass out (faint).  You cannot treat yourself. You may need someone to help you.  You have low blood sugar problems often.  You have problems from your medicines.  You are not feeling better after 3 to 4 days.  You have vision changes. MAKE SURE YOU:   Understand these instructions.  Will watch this condition.  Will get help right away if you are not doing well or get worse.  Document Released: 07/15/2009 Document Revised: 07/13/2011 Document Reviewed: 07/15/2009 [facility] Patient Information 2015 [place], [place]. This information is not intended to replace advice given to you by  your health care provider. Make sure you discuss any questions you have with your health care provider.

## 2014-07-26 NOTE — Patient Instructions (Addendum)
check your blood sugar twice a day: before the 3 meals, and at bedtime.  also check if you have symptoms of your blood sugar being too high or too low.  please keep a record of the readings and bring it to your next appointment here.  You can write it on any piece of paper.  please call us sooner if your blood sugar goes below 70, or if you have a lot of readings over 200.   Please continue the same lantus.   Please come back for a follow-up appointment in 2 months.      Type 1 Diabetes Mellitus Type 1 diabetes mellitus, often simply referred to as diabetes, is a long-term (chronic) disease. It occurs when the islet cells in the pancreas that make insulin (a hormone) are destroyed and can no longer make insulin. Insulin is needed to move sugars from food into the tissue cells. The tissue cells use the sugars for energy. In people with type 1 diabetes, the sugars build up in the blood instead of going into the tissue cells. As a result, high blood sugar (hyperglycemia) develops. Without insulin, the body breaks down fat cells for the needed energy. This breakdown of fat cells produces acid chemicals (ketones), which increases the acid levels in the body. The effect of either high ketone or high sugar (glucose) levels can be life-threatening.  Type 1 diabetes was also previously called juvenile diabetes. It most often occurs before the age of 79, but it can occur at any age. RISK FACTORS A person is predisposed to developing type 1 diabetes if someone in his or her family has the disease and is exposed to certain additional environmental triggers.  SYMPTOMS  Symptoms of type 1 diabetes may develop gradually over days to weeks or suddenly. The symptoms occur due to hyperglycemia. The symptoms can include:   Increased thirst (polydipsia).  Increased urination (polyuria).  Increased urination during the night (nocturia).  Weight loss. This weight loss may be rapid.  Frequent, recurring  infections.  Tiredness (fatigue).  Weakness.  Vision changes, such as blurred vision.  Fruity smell to your breath.  Abdominal pain.  Nausea or vomiting. DIAGNOSIS  Type 1 diabetes is diagnosed when symptoms of diabetes are present and when blood glucose levels are increased. Your blood glucose level may be checked by one or more of the following blood tests:  A fasting blood glucose test. You will not be allowed to eat for at least 8 hours before a blood sample is taken.  A random blood glucose test. Your blood glucose is checked at any time of the day regardless of when you ate.  A hemoglobin A1c blood glucose test. A hemoglobin A1c test provides information about blood glucose control over the previous 3 months. TREATMENT  Although type 1 diabetes cannot be prevented, it can be managed with insulin, diet, and exercise.  You will need to take insulin daily to keep blood glucose in the desired range.  You will need to match insulin dosing with exercise and healthy food choices. The treatment goal is to maintain the before-meal blood sugar (preprandial glucose) level at 70-130 mg/dL.  HOME CARE INSTRUCTIONS   Have your hemoglobin A1c level checked twice a year.  Perform daily blood glucose monitoring as directed by your health care provider.  Monitor urine ketones when you are ill and as directed by your health care provider.  Take your insulin as directed by your health care provider to maintain your blood glucose  level in the desired range.  Never run out of insulin. It is needed every day.  Adjust insulin based on your intake of carbohydrates. Carbohydrates can raise blood glucose levels but need to be included in your diet. Carbohydrates provide vitamins, minerals, and fiber, which are an essential part of a healthy diet. Carbohydrates are found in fruits, vegetables, whole grains, dairy products, legumes, and foods containing added sugars.  Eat healthy foods.  Alternate 3 meals with 3 snacks.  Maintain a healthy weight.  Carry a medical alert card or wear your medical alert jewelry.  Carry a 15-gram carbohydrate snack with you at all times to treat low blood glucose (hypoglycemia). Some examples of 15-gram carbohydrate snacks include:  Glucose tablets, 3 or 4.  Glucose gel, 15-gram tube.  Raisins, 2 tablespoons (24 grams).  Jelly beans, 6.  Animal crackers, 8.  Fruit juice, regular soda, or low-fat milk, 4 ounces (120 mL).  Gummy treats, 9.  Recognize hypoglycemia. Hypoglycemia occurs with blood glucose levels of 70 mg/dL and below. The risk for hypoglycemia increases when fasting or skipping meals, during or after intense exercise, and during sleep. Hypoglycemia symptoms can include:  Tremors or shakes.  Decreased ability to concentrate.  Sweating.  Increased heart rate.  Headache.  Dry mouth.  Hunger.  Irritability.  Anxiety.  Restless sleep.  Altered speech or coordination.  Confusion.  Treat hypoglycemia promptly. If you are alert and able to safely swallow, follow the 15:15 rule:  Take 15-20 grams of rapid-acting glucose or carbohydrate. Rapid-acting options include glucose gel, glucose tablets, or 4 ounces (120 mL) of fruit juice, regular soda, or low-fat milk.  Check your blood glucose level 15 minutes after taking the glucose.  Take 15-20 grams more of glucose if the repeat blood glucose level is still 70 mg/dL or below.  Eat a meal or snack within 1 hour once blood glucose levels return to normal.  Be alert to polyuria and polydipsia, which are early signs of hyperglycemia. An early awareness of hyperglycemia allows for prompt treatment. Treat hyperglycemia as directed by your health care provider.  Exercise regularly as directed by your health care provider. This includes:  Performing resistance training twice a week such as push-ups, sit-ups, lifting weights, or using resistance bands.  Performing  150 minutes of cardio exercises each week such as walking, running, or playing sports.  Staying active and spending no more than 90 minutes at one time being inactive.  Adjust your insulin dosing and food intake as needed if you start a new exercise or sport.  Follow your sick-day plan at any time you are unable to eat or drink as usual.   Do not use any tobacco products including cigarettes, chewing tobacco, or electronic cigarettes. If you need help quitting, ask your health care provider.  Limit alcohol intake to no more than 1 drink per day for nonpregnant women and 2 drinks per day for men. You should drink alcohol only when you are also eating food. Talk with your health care provider about whether alcohol is safe for you. Tell your health care provider if you drink alcohol several times a week.  Keep all follow-up visits as directed by your health care provider.  Schedule an eye exam within 5 years of diagnosis and then annually.  Perform daily skin and foot care. Examine your skin and feet daily for cuts, bruises, redness, nail problems, bleeding, blisters, or sores. A foot exam by a health care provider should be done annually.  Brush your teeth and gums at least twice a day and floss at least once a day. Follow up with your dentist regularly.  Share your diabetes management plan with your workplace or school.  Stay up-to-date with immunizations. It is recommended that people with diabetes who are over 82 years old get the pneumonia vaccine. In some cases, two separate shots may be given. Ask your health care provider if your pneumonia vaccination is up-to-date.  Learn to manage stress.  Obtain ongoing diabetes education and support as needed.  Participate in or seek rehabilitation as needed to maintain or improve independence and quality of life. Request a physical or occupational therapy referral if you are having foot or hand numbness, or difficulties with grooming, dressing,  eating, or physical activity. SEEK MEDICAL CARE IF:   You are unable to eat food or drink fluids for more than 6 hours.  You have nausea and vomiting for more than 6 hours.  Your blood glucose level is over 240 mg/dL.  There is a change in mental status.  You develop an additional serious illness.  You have diarrhea for more than 6 hours.  You have been sick or have had a fever for a couple of days and are not getting better.  You have pain during any physical activity. SEEK IMMEDIATE MEDICAL CARE IF:  You have difficulty breathing.  You have moderate to large ketone levels. MAKE SURE YOU:  Understand these instructions.  Will watch your condition.  Will get help right away if you are not doing well or get worse. Document Released: 04/17/2000 Document Revised: 09/04/2013 Document Reviewed: 11/17/2011 [facility] Patient Information 2015 [place], [place]. This information is not intended to replace advice given to you by your health care provider. Make sure you discuss any questions you have with your health care provider. Low Blood Sugar Low blood sugar (hypoglycemia) means that the level of sugar in your blood is lower than it should be. Signs of low blood sugar include:  Getting sweaty.  Feeling hungry.  Feeling dizzy or weak.  Feeling sleepier than normal.  Feeling nervous.  Headaches.  Having a fast heartbeat. Low blood sugar can happen fast and can be an emergency. Your doctor can do tests to check your blood sugar level. You can have low blood sugar and not have diabetes. HOME CARE  Check your blood sugar as told by your doctor. If it is less than 70 mg/dl or as told by your doctor, take 1 of the following:  3 to 4 glucose tablets.   cup clear juice.   cup soda pop, not diet.  1 cup milk.  5 to 6 hard candies.  Recheck blood sugar after 15 minutes. Repeat until it is at the right level.  Eat a snack if it is more than 1 hour until the next  meal.  Only take medicine as told by your doctor.  Do not skip meals. Eat on time.  Do not drink alcohol except with meals.  Check your blood glucose before driving.  Check your blood glucose before and after exercise.  Always carry treatment with you, such as glucose pills.  Always wear a medical alert bracelet if you have diabetes. GET HELP RIGHT AWAY IF:   Your blood glucose goes below 70 mg/dl or as told by your doctor, and you:  Are confused.  Are not able to swallow.  Pass out (faint).  You cannot treat yourself. You may need someone to help you.  You have  low blood sugar problems often.  You have problems from your medicines.  You are not feeling better after 3 to 4 days.  You have vision changes. MAKE SURE YOU:   Understand these instructions.  Will watch this condition.  Will get help right away if you are not doing well or get worse. Document Released: 07/15/2009 Document Revised: 07/13/2011 Document Reviewed: 07/15/2009 [facility] Patient Information 2015 [place], [place]. This information is not intended to replace advice given to you by your health care provider. Make sure you discuss any questions you have with your health care provider.

## 2014-09-25 ENCOUNTER — Ambulatory Visit: Payer: Medicaid Other | Admitting: Endocrinology

## 2014-09-27 ENCOUNTER — Encounter: Payer: Self-pay | Admitting: Endocrinology

## 2014-09-27 ENCOUNTER — Ambulatory Visit (INDEPENDENT_AMBULATORY_CARE_PROVIDER_SITE_OTHER): Payer: Medicaid Other | Admitting: Endocrinology

## 2014-09-27 VITALS — BP 128/82 | HR 82 | Temp 98.1°F | Ht 69.0 in | Wt 205.0 lb

## 2014-09-27 DIAGNOSIS — E109 Type 1 diabetes mellitus without complications: Secondary | ICD-10-CM | POA: Diagnosis not present

## 2014-09-27 NOTE — Patient Instructions (Addendum)
check your blood sugar twice a day: before the 3 meals, and at bedtime.  also check if you have symptoms of your blood sugar being too high or too low.  please keep a record of the readings and bring it to your next appointment here.  You can write it on any piece of paper.  please call us sooner if your blood sugar goes below 70, or if you have a lot of readings over 200.   blood tests are requested for you today.  We'll let you know about the results. Please come back for a follow-up appointment in 3 months.

## 2014-09-27 NOTE — Progress Notes (Signed)
Subjective:    Patient ID: Justin Wilkinson, male    DOB: [birth year], 34 y.o.   MRN: [id number]  HPI Pt returns for f/u of diabetes mellitus: DM type: 1 Dx'ed: 2014 Complications: none. Therapy: insulin since dx. DKA: only at dx. Severe hypoglycemia: never Pancreatitis: never Other: he is willing to take insulin only as often as qd.   Interval history: since back on insulin, cbg's vary from 90-162.  There is no trend throughout the day.  pt states he feels no different, and well in general.   Past Medical History  Diagnosis Date  . Diabetes mellitus without complication     No past surgical history on file.  History   Social History  . Marital Status: Single    Spouse Name: N/A  . Number of Children: N/A  . Years of Education: N/A   Occupational History  . Not on file.   Social History Main Topics  . Smoking status: Former Smoker -- 0.50 packs/day    Types: Cigarettes    Quit date: 12/15/2012  . Smokeless tobacco: Never Used  . Alcohol Use: No  . Drug Use: No  . Sexual Activity: No   Other Topics Concern  . Not on file   Social History Narrative    Current Outpatient Prescriptions on File Prior to Visit  Medication Sig Dispense Refill  . Alcohol Swabs PADS 1 Container by Does not apply route 2 (two) times daily. 100 each 12  . atorvastatin (LIPITOR) 40 MG tablet Take 1 tablet (40 mg total) by mouth daily. 30 tablet 3  . FINGERSTIX LANCETS MISC 1 Container by Does not apply route 2 (two) times daily. 200 each 2  . glucose blood (ACCU-CHEK SMARTVIEW) test strip 1 each by Other route 2 (two) times daily. And lancets 2/day 100 each 12  . Insulin Glargine (LANTUS SOLOSTAR) 100 UNIT/ML Solostar Pen Inject 40 Units into the skin every morning. And pen needles 1/day 20 mL 11  . lisinopril (PRINIVIL,ZESTRIL) 10 MG tablet Take 1 tablet (10 mg total) by mouth daily. 90 tablet 3  . Multiple Vitamin (MULTIVITAMIN WITH MINERALS) TABS tablet Take 1 tablet by mouth daily.      . pantoprazole (PROTONIX) 40 MG tablet Take 1 tablet (40 mg total) by mouth daily. 30 tablet 3   No current facility-administered medications on file prior to visit.    Allergies  Allergen Reactions  . Fish Allergy Anaphylaxis  . Shellfish Allergy Anaphylaxis    Family History  Problem Relation Age of Onset  . Lung cancer Father   . Diabetes Maternal Grandmother     BP 128/82 mmHg  Pulse 82  Temp(Src) 98.1 F (36.7 C) (Oral)  Ht  (1.753 m)  Wt 205 lb (92.987 kg)  BMI 30.26 kg/m2  SpO2 95%  Review of Systems He denies hypoglycemia    Objective:   Physical Exam VITAL SIGNS:  See vs page GENERAL: no distress Pulses: dorsalis pedis intact bilat.   MSK: no deformity of the feet CV: no leg edema Skin:  no ulcer on the feet.  normal color and temp on the feet. Neuro: sensation is intact to touch on the feet        Assessment & Plan:  DM: glycemic control is apparently much better  Patient is advised the following: Patient Instructions  check your blood sugar twice a day: before the 3 meals, and at bedtime.  also check if you have symptoms of your blood sugar being  too high or too low.  please keep a record of the readings and bring it to your next appointment here.  You can write it on any piece of paper.  please call us sooner if your blood sugar goes below 70, or if you have a lot of readings over 200.   blood tests are requested for you today.  We'll let you know about the results. Please come back for a follow-up appointment in 3 months.

## 2014-10-29 ENCOUNTER — Other Ambulatory Visit: Payer: Self-pay

## 2014-10-29 MED ORDER — INSULIN PEN NEEDLE 31G X 5 MM MISC: Status: DC

## 2014-12-28 ENCOUNTER — Ambulatory Visit (INDEPENDENT_AMBULATORY_CARE_PROVIDER_SITE_OTHER): Payer: Medicaid Other | Admitting: Endocrinology

## 2014-12-28 ENCOUNTER — Encounter: Payer: Self-pay | Admitting: Endocrinology

## 2014-12-28 VITALS — BP 122/84 | HR 73 | Temp 97.9°F | Ht 69.0 in | Wt 192.0 lb

## 2014-12-28 DIAGNOSIS — E1069 Type 1 diabetes mellitus with other specified complication: Secondary | ICD-10-CM

## 2014-12-28 DIAGNOSIS — E139 Other specified diabetes mellitus without complications: Secondary | ICD-10-CM

## 2014-12-28 DIAGNOSIS — E108 Type 1 diabetes mellitus with unspecified complications: Principal | ICD-10-CM

## 2014-12-28 DIAGNOSIS — IMO0002 Reserved for concepts with insufficient information to code with codable children: Secondary | ICD-10-CM

## 2014-12-28 DIAGNOSIS — E1065 Type 1 diabetes mellitus with hyperglycemia: Secondary | ICD-10-CM

## 2014-12-28 LAB — POCT GLYCOSYLATED HEMOGLOBIN (HGB A1C): Hemoglobin A1C: 12.8

## 2014-12-28 MED ORDER — ACCU-CHEK AVIVA DEVI: Status: AC

## 2014-12-28 MED ORDER — GLUCOSE BLOOD VI STRP: 1.0000 | ORAL_STRIP | Freq: Two times a day (BID) | Status: DC

## 2014-12-28 MED ORDER — INSULIN GLARGINE 100 UNIT/ML SOLOSTAR PEN: 40.0000 [IU] | PEN_INJECTOR | SUBCUTANEOUS | Status: DC

## 2014-12-28 NOTE — Patient Instructions (Addendum)
check your blood sugar twice a day: before the 3 meals, and at bedtime.  also check if you have symptoms of your blood sugar being too high or too low.  please keep a record of the readings and bring it to your next appointment here.  You can write it on any piece of paper.  please call us sooner if your blood sugar goes below 70, or if you have a lot of readings over 200.   Please come back for a follow-up appointment in 3 months.   Please resume the insulin, and also get a new meter.  I have sent prescriptions to your pharmacy, for both of these.

## 2014-12-28 NOTE — Progress Notes (Signed)
Subjective:    Patient ID: Justin Wilkinson, male    DOB: [birth year], 34 y.o.   MRN: [id number]  HPI Pt returns for f/u of diabetes mellitus: DM type: 1 Dx'ed: 2014 Complications: none. Therapy: insulin since dx. DKA: only at dx. Severe hypoglycemia: never Pancreatitis: never Other: he is willing to take insulin only as often as qd.   Interval history:  pt states he feels no different, and well in general.  He says he has not taken insulin in the past 2 months.   Past Medical History  Diagnosis Date  . Diabetes mellitus without complication     No past surgical history on file.  Social History   Social History  . Marital Status: Single    Spouse Name: N/A  . Number of Children: N/A  . Years of Education: N/A   Occupational History  . Not on file.   Social History Main Topics  . Smoking status: Former Smoker -- 0.50 packs/day    Types: Cigarettes    Quit date: 12/15/2012  . Smokeless tobacco: Never Used  . Alcohol Use: No  . Drug Use: No  . Sexual Activity: No   Other Topics Concern  . Not on file   Social History Narrative    Current Outpatient Prescriptions on File Prior to Visit  Medication Sig Dispense Refill  . Alcohol Swabs PADS 1 Container by Does not apply route 2 (two) times daily. 100 each 12  . atorvastatin (LIPITOR) 40 MG tablet Take 1 tablet (40 mg total) by mouth daily. 30 tablet 3  . FINGERSTIX LANCETS MISC 1 Container by Does not apply route 2 (two) times daily. 200 each 2  . lisinopril (PRINIVIL,ZESTRIL) 10 MG tablet Take 1 tablet (10 mg total) by mouth daily. 90 tablet 3  . Multiple Vitamin (MULTIVITAMIN WITH MINERALS) TABS tablet Take 1 tablet by mouth daily.    . pantoprazole (PROTONIX) 40 MG tablet Take 1 tablet (40 mg total) by mouth daily. 30 tablet 3   No current facility-administered medications on file prior to visit.    Allergies  Allergen Reactions  . Fish Allergy Anaphylaxis  . Shellfish Allergy Anaphylaxis    Family  History  Problem Relation Age of Onset  . Lung cancer Father   . Diabetes Maternal Grandmother     BP 122/84 mmHg  Pulse 73  Temp(Src) 97.9 F (36.6 C) (Oral)  Ht 5\' 9"  (1.753 m)  Wt 192 lb (87.091 kg)  BMI 28.34 kg/m2  SpO2 97%  Review of Systems He has lost weight.  Denies n/v.     Objective:   Physical Exam VITAL SIGNS:  See vs page GENERAL: no distress Pulses: dorsalis pedis intact bilat.   MSK: no deformity of the feet CV: no leg edema Skin:  no ulcer on the feet.  normal color and temp on the feet. Neuro: sensation is intact to touch on the feet.     A1c=12.8%    Assessment & Plan:  DM: poor control persists Noncompliance, worse: I'll work around this as best I can, but long-term prognosis is poor.   Weight loss, due to severe hyperglycemia.  We'll follow.   Patient is advised the following: Patient Instructions  check your blood sugar twice a day: before the 3 meals, and at bedtime.  also check if you have symptoms of your blood sugar being too high or too low.  please keep a record of the readings and bring it to your next appointment here.  You can write it on any piece of paper.  please call us sooner if your blood sugar goes below 70, or if you have a lot of readings over 200.   Please come back for a follow-up appointment in 3 months.   Please resume the insulin, and also get a new meter.  I have sent prescriptions to your pharmacy, for both of these.

## 2015-04-01 ENCOUNTER — Ambulatory Visit: Payer: Medicaid Other | Admitting: Endocrinology

## 2015-04-01 DIAGNOSIS — Z0289 Encounter for other administrative examinations: Secondary | ICD-10-CM

## 2015-04-03 ENCOUNTER — Telehealth: Payer: Self-pay | Admitting: Endocrinology

## 2015-04-03 NOTE — Telephone Encounter (Signed)
Appointment letter mailed to the pt.  

## 2015-04-03 NOTE — Telephone Encounter (Signed)
Patient no showed today's appt. Please advise on how to follow up. °A. No follow up necessary. °B. Follow up urgent. Contact patient immediately. °C. Follow up necessary. Contact patient and schedule visit in ___ days. °D. Follow up advised. Contact patient and schedule visit in ____weeks. ° °

## 2015-04-03 NOTE — Telephone Encounter (Signed)
Follow up advised. Contact patient and schedule visit in 3 months. 

## 2015-05-01 IMAGING — CR DG CHEST 1V PORT
1 series · 1 of 1 positions shown · non-contrast
Comparison: None.

CLINICAL DATA: Weakness

PORTABLE CHEST - 1 VIEW

[AP]
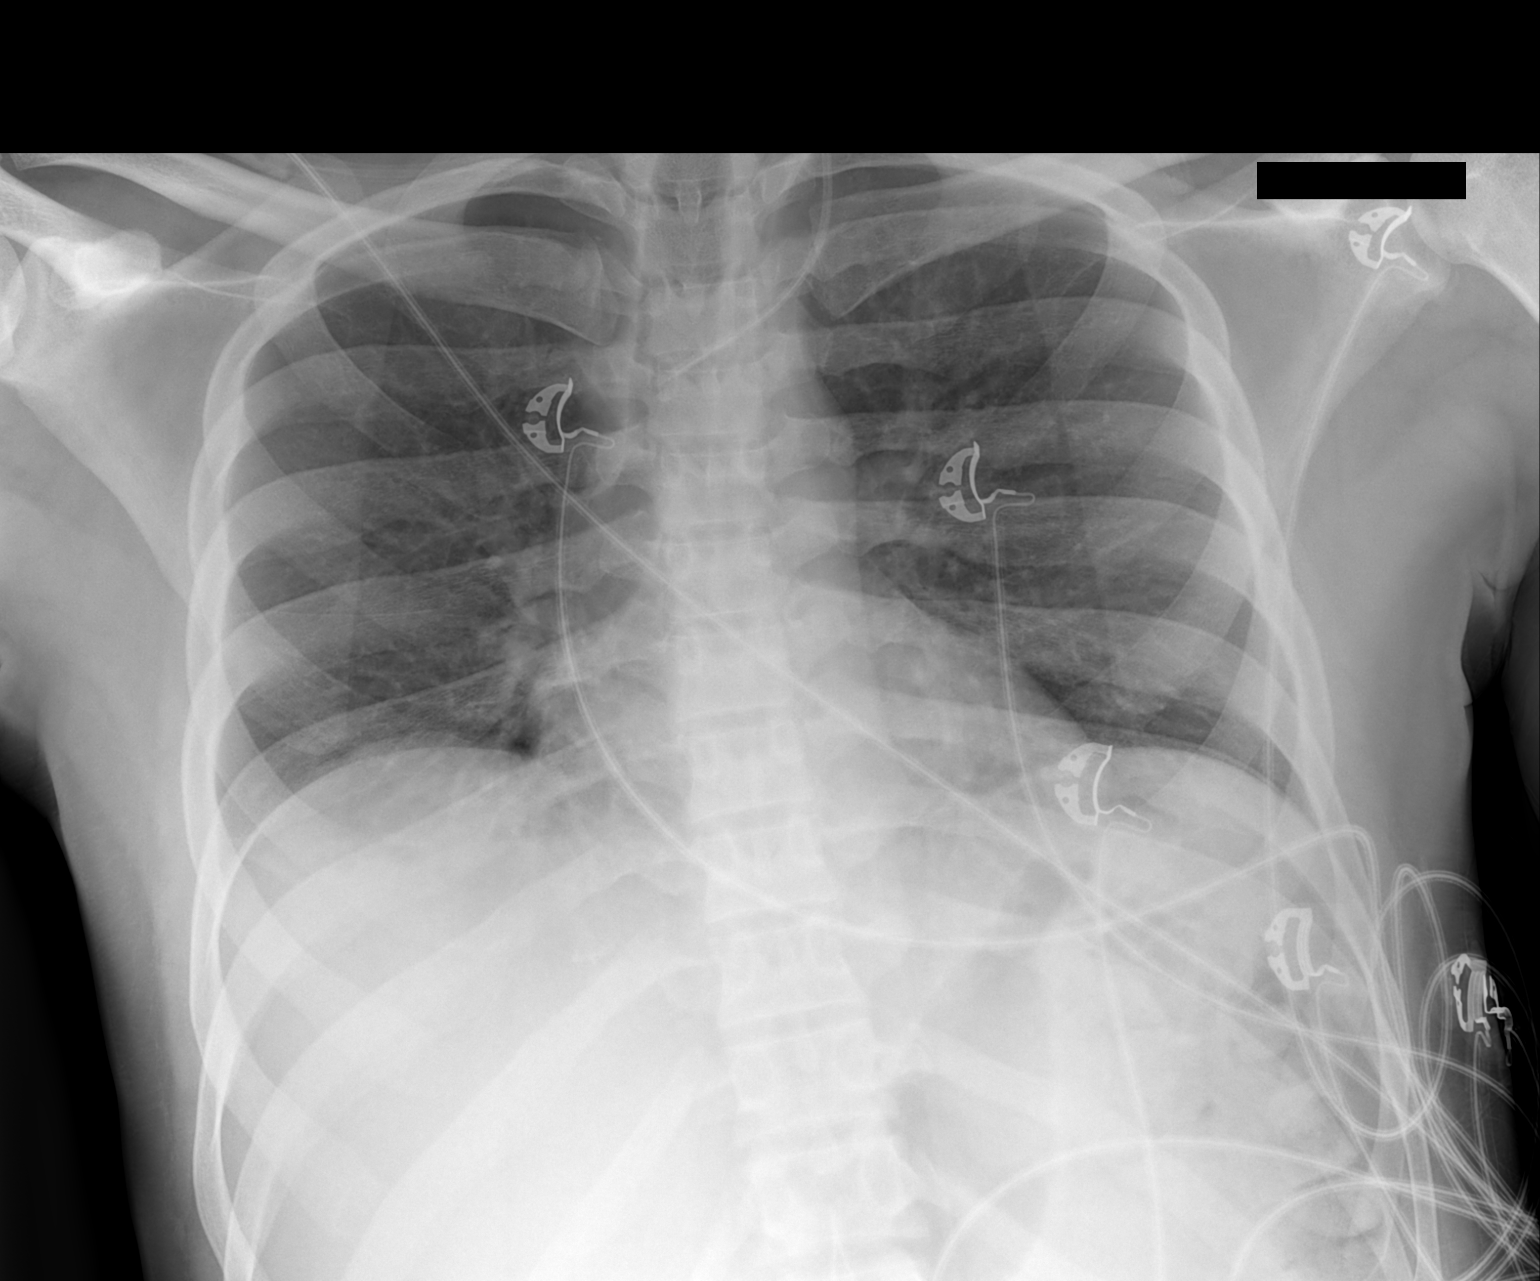

[1 of 1 positions shown; findings below may reference images not displayed]

FINDINGS: Cardiomediastinal silhouette appears normal.  No acute
pulmonary disease is noted.  Bony thorax is intact.  Left internal
jugular venous catheter is noted with tip in expected position of
the left brachiocephalic vein. No pneumothorax or pleural effusion
is noted.
IMPRESSION: No acute cardiopulmonary abnormality seen.

## 2015-05-17 ENCOUNTER — Encounter: Payer: Self-pay | Admitting: Internal Medicine

## 2015-05-17 ENCOUNTER — Ambulatory Visit (INDEPENDENT_AMBULATORY_CARE_PROVIDER_SITE_OTHER): Payer: BLUE CROSS/BLUE SHIELD | Admitting: Internal Medicine

## 2015-05-17 ENCOUNTER — Other Ambulatory Visit (INDEPENDENT_AMBULATORY_CARE_PROVIDER_SITE_OTHER): Payer: BLUE CROSS/BLUE SHIELD

## 2015-05-17 VITALS — BP 132/84 | HR 84 | Temp 98.1°F | Resp 16 | Ht 69.0 in | Wt 199.0 lb

## 2015-05-17 DIAGNOSIS — E785 Hyperlipidemia, unspecified: Secondary | ICD-10-CM

## 2015-05-17 DIAGNOSIS — E108 Type 1 diabetes mellitus with unspecified complications: Secondary | ICD-10-CM | POA: Diagnosis not present

## 2015-05-17 DIAGNOSIS — I10 Essential (primary) hypertension: Secondary | ICD-10-CM | POA: Diagnosis not present

## 2015-05-17 DIAGNOSIS — E1065 Type 1 diabetes mellitus with hyperglycemia: Secondary | ICD-10-CM

## 2015-05-17 DIAGNOSIS — IMO0002 Reserved for concepts with insufficient information to code with codable children: Secondary | ICD-10-CM

## 2015-05-17 LAB — COMPREHENSIVE METABOLIC PANEL
ALBUMIN: 4 g/dL (ref 3.5–5.2)
ALK PHOS: 37 U/L — AB (ref 39–117)
ALT: 13 U/L (ref 0–53)
AST: 13 U/L (ref 0–37)
BILIRUBIN TOTAL: 0.3 mg/dL (ref 0.2–1.2)
BUN: 24 mg/dL — AB (ref 6–23)
CO2: 28 mEq/L (ref 19–32)
Calcium: 9.3 mg/dL (ref 8.4–10.5)
Chloride: 105 mEq/L (ref 96–112)
Creatinine, Ser: 1.02 mg/dL (ref 0.40–1.50)
GFR: 106.83 mL/min (ref >60.00)
Glucose, Bld: 127 mg/dL — ABNORMAL HIGH (ref 70–99)
POTASSIUM: 4.4 meq/L (ref 3.5–5.1)
SODIUM: 138 meq/L (ref 135–145)
TOTAL PROTEIN: 7.2 g/dL (ref 6.0–8.3)

## 2015-05-17 LAB — HEMOGLOBIN A1C: HEMOGLOBIN A1C: 7.7 % — AB (ref 4.6–6.5)

## 2015-05-17 LAB — LIPID PANEL
Cholesterol: 164 mg/dL (ref 0–200)
HDL: 30.9 mg/dL — AB (ref >39.00)
LDL Cholesterol: 116 mg/dL — ABNORMAL HIGH (ref 0–99)
NONHDL: 133.51
Total CHOL/HDL Ratio: 5
Triglycerides: 89 mg/dL (ref 0.0–149.0)
VLDL: 17.8 mg/dL (ref 0.0–40.0)

## 2015-05-17 MED ORDER — INSULIN GLARGINE 100 UNIT/ML SOLOSTAR PEN: 40.0000 [IU] | PEN_INJECTOR | SUBCUTANEOUS | Status: DC

## 2015-05-17 MED ORDER — LISINOPRIL 10 MG PO TABS: 10.0000 mg | ORAL_TABLET | Freq: Every day | ORAL | Status: DC

## 2015-05-17 NOTE — Assessment & Plan Note (Signed)
Checking lipid panel off meds. At this time more important for him to be on meds for his diabetes and once doing better will get him back on the statin.

## 2015-05-17 NOTE — Progress Notes (Signed)
Pre visit review using our clinic review tool, if applicable. No additional management support is needed unless otherwise documented below in the visit note. 

## 2015-05-17 NOTE — Patient Instructions (Signed)
We have sent in refills of the blood pressure medicine called lisinopril which also helps to protect the kidneys from the sugars. Take 1 pill daily.   We have sent in the insulin for you and want you to at least start taking 1 shot per day 45 units.   We need you to go back to the endocrine doctor and talk to them about an insulin pump as an option that would be easier on your life but still help with the sugars.   We are checking the blood work today and will call you back with the results.   We would like to see you back in 3 months if you have not seen the endocrine doctor yet. It is really important to see them. Call us if you want to see someone else.   How to Avoid Diabetes Problems You can do a lot to prevent or slow down diabetes problems. Following your diabetes plan and taking care of yourself can reduce your risk of serious or life-threatening complications. Below, you will find certain things you can do to prevent diabetes problems. MANAGE YOUR DIABETES Follow your health care provider's, nurse educator's, and dietitian's instructions for managing your diabetes. They will teach you the basics of diabetes care. They can help answer questions you may have. Learn about diabetes and make healthy choices regarding eating and physical activity. Monitor your blood glucose level regularly. Your health care provider will help you decide how often to check your blood glucose level depending on your treatment goals and how well you are meeting them.  DO NOT USE NICOTINE Nicotine and diabetes are a dangerous combination. Nicotine raises your risk for diabetes problems. If you quit using nicotine, you will lower your risk for heart attack, stroke, nerve disease, and kidney disease. Your cholesterol and your blood pressure levels may improve. Your blood circulation will also improve. Do not use any tobacco products, including cigarettes, chewing tobacco, or electronic cigarettes. If you need help  quitting, ask your health care provider. KEEP YOUR BLOOD PRESSURE UNDER CONTROL Your health care provider will determine your individualized target blood pressure based on your age, your medicines, how long you have had diabetes, and any other medical conditions you have. Blood pressure consists of two numbers. Generally, the goal is to keep your top number (systolic pressure) at or below 130, and your bottom number (diastolic pressure) at or below 80. Your health care provider may recommend a lower target blood pressure reading, if appropriate. Meal planning, medicines, and exercise can help you reach your target blood pressure. Make sure your health care provider checks your blood pressure at every visit. KEEP YOUR CHOLESTEROL UNDER CONTROL Normal cholesterol levels will help prevent heart disease and stroke. These are the biggest health problems for people with diabetes. Keeping cholesterol levels under control can also help with blood flow. Have your cholesterol level checked at least once a year. Your health care provider may prescribe a medicine known as a statin. Statins lower your cholesterol. If you are not taking a statin, ask your health care provider if you should be. Meal planning, exercise, and medicines can help you reach your cholesterol targets.  SCHEDULE AND KEEP YOUR ANNUAL PHYSICAL EXAMS AND EYE EXAMS Your health care provider will tell you how often he or she wants to see you depending on your plan of treatment. It is important that you keep these appointments so that possible problems can be identified early and complications can be avoided or treated.  Every visit with your health care provider should include your weight, blood pressure, and an evaluation of your blood glucose control.  Your hemoglobin A1c should be checked:  At least twice a year if you are at your goal.  Every 3 months if there are changes in treatment.  If you are not meeting your goals.  Your blood lipids  should be checked yearly. You should also be checked yearly to see if you have protein in your urine (microalbumin).  Schedule a dilated eye exam within 5 years of your diagnosis if you have type 1 diabetes, and then yearly. Schedule a dilated eye exam at diagnosis if you have type 2 diabetes, and then yearly. All exams thereafter can be extended to every 2 to 3 years if one or more exams have been normal. KEEP YOUR VACCINES CURRENT It is recommended that you receive a flu (influenza) vaccine every year. It is also recommended that you receive a pneumonia (pneumococcal) vaccine. If you are 13 years of age or older and have never received a pneumonia vaccine, this vaccine may be given as a series of two separate shots. Ask your health care provider which additional vaccines may be recommended. TAKE CARE OF YOUR FEET  Diabetes may cause you to have a poor blood supply (circulation) to your legs and feet. Because of this, the skin may be thinner, break easier, and heal more slowly. You also may have nerve damage in your legs and feet, causing decreased feeling. You may not notice minor injuries to your feet that could lead to serious problems or infections. Taking care of your feet is very important. Visual foot exams are performed at every routine medical visit. The exams check for cuts, injuries, or other problems with the feet. A comprehensive foot exam should be done yearly. This includes visual inspection as well as assessing foot pulses and testing for loss of sensation. You should also do the following:  Inspect your feet daily for cuts, calluses, blisters, ingrown toenails, and signs of infection, such as redness, swelling, or pus.  Wash and dry your feet thoroughly, especially between the toes.  Avoid soaking your feet regularly in hot water baths.  Moisturize dry skin with lotion, avoiding areas between your toes.  Cut toenails straight across and file the edges.  Avoid shoes that do not  fit well or have areas that irritate your skin.  Avoid going barefooted or wearing only socks. Your feet need protection. TAKE CARE OF YOUR TEETH People with poorly controlled diabetes are more likely to have gum (periodontal) disease. These infections make diabetes harder to control. Periodontal diseases, if left untreated, can lead to tooth loss. Brush your teeth twice a day, floss, and see your dentist for checkups and cleaning every 6 months, or 2 times a year. ASK YOUR HEALTH CARE PROVIDER ABOUT TAKING ASPIRIN Taking aspirin daily is recommended to help prevent cardiovascular disease in people with and without diabetes. Ask your health care provider if this would benefit you and what dose he or she would recommend. DRINK RESPONSIBLY Moderate amounts of alcohol (less than 1 drink per day for adult women and less than 2 drinks per day for adult men) have a minimal effect on blood glucose if ingested with food. It is important to eat food with alcohol to avoid hypoglycemia. People should avoid alcohol if they have a history of alcohol abuse or dependence, if they are pregnant, and if they have liver disease, pancreatitis, advanced neuropathy, or severe hypertriglyceridemia.  LESSEN STRESS Living with diabetes can be stressful. When you are under stress, your blood glucose may be affected in two ways:  Stress hormones may cause your blood glucose to rise.  You may be distracted from taking good care of yourself. It is a good idea to be aware of your stress level and make changes that are necessary to help you better manage challenging situations. Support groups, planned relaxation, a hobby you enjoy, meditation, healthy relationships, and exercise all work to lower your stress level. If your efforts do not seem to be helping, get help from your health care provider or a trained mental health professional.   This information is not intended to replace advice given to you by your health care provider.  Make sure you discuss any questions you have with your health care provider.   Document Released: 01/06/2011 Document Revised: 05/11/2014 Document Reviewed: 06/14/2013 Elsevier Interactive Patient Education 2016 [place].  Diabetes and Standards of Medical Care Diabetes is complicated. You may find that your diabetes team includes a dietitian, nurse, diabetes educator, eye doctor, and more. To help everyone know what is going on and to help you get the care you deserve, the following schedule of care was developed to help keep you on track. Below are the tests, exams, vaccines, medicines, education, and plans you will need. HbA1c test This test shows how well you have controlled your glucose over the past 2-3 months. It is used to see if your diabetes management plan needs to be adjusted.   It is performed at least 2 times a year if you are meeting treatment goals.  It is performed 4 times a year if therapy has changed or if you are not meeting treatment goals. Blood pressure test  This test is performed at every routine medical visit. The goal is less than 140/90 mm Hg for most people, but 130/80 mm Hg in some cases. Ask your health care provider about your goal. Dental exam  Follow up with the dentist regularly. Eye exam  If you are diagnosed with type 1 diabetes as a child, get an exam upon reaching the age of 79 years or older and having had diabetes for 3-5 years. Yearly eye exams are recommended after that initial eye exam.  If you are diagnosed with type 1 diabetes as an adult, get an exam within 5 years of diagnosis and then yearly.  If you are diagnosed with type 2 diabetes, get an exam as soon as possible after the diagnosis and then yearly. Foot care exam  Visual foot exams are performed at every routine medical visit. The exams check for cuts, injuries, or other problems with the feet.  You should have a complete foot exam performed every year. This exam includes an  inspection of the structure and skin of your feet, a check of the pulses in your feet, and a check of the sensation in your feet.  Type 1 diabetes: The first exam is performed 5 years after diagnosis.  Type 2 diabetes: The first exam is performed at the time of diagnosis.  Check your feet nightly for cuts, injuries, or other problems with your feet. Tell your health care provider if anything is not healing. Kidney function test (urine microalbumin)  This test is performed once a year.  Type 1 diabetes: The first test is performed 5 years after diagnosis.  Type 2 diabetes: The first test is performed at the time of diagnosis.  A serum creatinine and estimated glomerular  filtration rate (eGFR) test is done once a year to assess the level of chronic kidney disease (CKD), if present. Lipid profile (cholesterol, HDL, LDL, triglycerides)  Performed every 5 years for most people.  The goal for LDL is less than 100 mg/dL. If you are at high risk, the goal is less than 70 mg/dL.  The goal for HDL is 40 mg/dL-50 mg/dL for men and 50 mg/dL-60 mg/dL for women. An HDL cholesterol of 60 mg/dL or higher gives some protection against heart disease.  The goal for triglycerides is less than 150 mg/dL. Immunizations  The flu (influenza) vaccine is recommended yearly for every person 109 months of age or older who has diabetes.  The pneumonia (pneumococcal) vaccine is recommended for every person 15 years of age or older who has diabetes. Adults 63 years of age or older may receive the pneumonia vaccine as a series of two separate shots.  The hepatitis B vaccine is recommended for adults shortly after they have been diagnosed with diabetes.  The Tdap (tetanus, diphtheria, and pertussis) vaccine should be given:  According to normal childhood vaccination schedules, for children.  Every 10 years, for adults who have diabetes. Diabetes self-management education  Education is recommended at diagnosis  and ongoing as needed. Treatment plan  Your treatment plan is reviewed at every medical visit.   This information is not intended to replace advice given to you by your health care provider. Make sure you discuss any questions you have with your health care provider.   Document Released: 02/15/2009 Document Revised: 05/11/2014 Document Reviewed: 09/20/2012 Elsevier Interactive Patient Education [employer].

## 2015-05-17 NOTE — Progress Notes (Signed)
   Subjective:    Patient ID: Justin Wilkinson, male    DOB: [birth year], 35 y.o.   MRN: [id number]  HPI The patient is a 35 YO man coming in for uncontrolled type 1 diabetes. Diagnosed about 2 years ago with DKA and coma. Since that time not taking insulin well and previously was on lantus only regimen. He is off all insulins for 7 months or so. Denies feeling badly. Some increased thirst and urination. No blurry vision, tingling or numbness in his feet. No eye exam recently. No stomach pains. Denies chest pains, SOB. No nausea or vomiting.   PMH, [facility], social history reviewed and updated.   Review of Systems  Constitutional: Negative for fever, activity change, appetite change, fatigue and unexpected weight change.  HENT: Negative.   Eyes: Negative.   Respiratory: Negative for cough, chest tightness, shortness of breath and wheezing.   Cardiovascular: Negative for chest pain, palpitations and leg swelling.  Gastrointestinal: Negative for nausea, abdominal pain, diarrhea, constipation and abdominal distention.  Endocrine: Positive for polydipsia and polyuria.  Musculoskeletal: Negative.   Skin: Negative.   Neurological: Negative.   Psychiatric/Behavioral: Negative.       Objective:   Physical Exam  Constitutional: He is oriented to person, place, and time. He appears well-developed and well-nourished.  HENT:  Head: Normocephalic and atraumatic.  Eyes: EOM are normal.  Neck: Normal range of motion.  Cardiovascular: Normal rate and regular rhythm.   Pulmonary/Chest: Effort normal and breath sounds normal. No respiratory distress. He has no wheezes. He has no rales.  Abdominal: Soft. Bowel sounds are normal. He exhibits no distension. There is no tenderness. There is no rebound.  Musculoskeletal: He exhibits no edema.  Neurological: He is alert and oriented to person, place, and time. Coordination normal.  Skin: Skin is warm and dry.  See foot exam  Psychiatric: He has a normal mood and  affect.   Filed Vitals:   05/17/15 1034  BP: 132/84  Pulse: 84  Temp: 98.1 F (36.7 C)  TempSrc: Oral  Resp: 16  Height:  (1.753 m)  Weight: 199 lb (90.266 kg)  SpO2: 96%      Assessment & Plan:  Visit time 45 minutes: greater than 50% of that was spent in counseling and coordination of care (counseling on type 1 diabetes etiology and long term complications and treatment considerations and insulin pump mechanism).

## 2015-05-17 NOTE — Assessment & Plan Note (Signed)
Last HgA1c >10 and type 1 without insulin for about 7 months. Not willing to do multiple shots per day. Talked to him extensively about the etiology of type 1 diabetes and prognosis as his pancreas burns out from making small amounts of insulin. Went over complications and the seriousness of his condition. He is willing to go back on lantus daily and see endo and talk to them about pump. This may be a better option for his lifestyle. Also given rx for lisinopril and talked to him about how this helps to protect his kidneys from his high sugars.

## 2015-08-15 ENCOUNTER — Ambulatory Visit: Payer: BLUE CROSS/BLUE SHIELD | Admitting: Internal Medicine

## 2015-08-15 DIAGNOSIS — Z0289 Encounter for other administrative examinations: Secondary | ICD-10-CM

## 2015-10-21 ENCOUNTER — Ambulatory Visit: Payer: BLUE CROSS/BLUE SHIELD | Admitting: Internal Medicine

## 2015-10-21 ENCOUNTER — Encounter: Payer: Self-pay | Admitting: Internal Medicine

## 2015-10-21 ENCOUNTER — Ambulatory Visit (INDEPENDENT_AMBULATORY_CARE_PROVIDER_SITE_OTHER): Payer: BLUE CROSS/BLUE SHIELD | Admitting: Internal Medicine

## 2015-10-21 ENCOUNTER — Other Ambulatory Visit (INDEPENDENT_AMBULATORY_CARE_PROVIDER_SITE_OTHER): Payer: BLUE CROSS/BLUE SHIELD

## 2015-10-21 VITALS — BP 112/74 | HR 76 | Temp 98.3°F | Resp 14 | Ht 68.0 in | Wt 212.4 lb

## 2015-10-21 DIAGNOSIS — IMO0002 Reserved for concepts with insufficient information to code with codable children: Secondary | ICD-10-CM

## 2015-10-21 DIAGNOSIS — E109 Type 1 diabetes mellitus without complications: Secondary | ICD-10-CM

## 2015-10-21 DIAGNOSIS — I10 Essential (primary) hypertension: Secondary | ICD-10-CM

## 2015-10-21 DIAGNOSIS — E1065 Type 1 diabetes mellitus with hyperglycemia: Secondary | ICD-10-CM | POA: Diagnosis not present

## 2015-10-21 DIAGNOSIS — E108 Type 1 diabetes mellitus with unspecified complications: Secondary | ICD-10-CM

## 2015-10-21 DIAGNOSIS — IMO0001 Reserved for inherently not codable concepts without codable children: Secondary | ICD-10-CM

## 2015-10-21 LAB — COMPREHENSIVE METABOLIC PANEL
ALK PHOS: 36 U/L — AB (ref 39–117)
ALT: 19 U/L (ref 0–53)
AST: 16 U/L (ref 0–37)
Albumin: 4.2 g/dL (ref 3.5–5.2)
BUN: 20 mg/dL (ref 6–23)
CALCIUM: 9.7 mg/dL (ref 8.4–10.5)
CHLORIDE: 103 meq/L (ref 96–112)
CO2: 24 meq/L (ref 19–32)
Creatinine, Ser: 0.98 mg/dL (ref 0.40–1.50)
GFR: 111.6 mL/min (ref >60.00)
GLUCOSE: 87 mg/dL (ref 70–99)
POTASSIUM: 4.1 meq/L (ref 3.5–5.1)
Sodium: 137 mEq/L (ref 135–145)
TOTAL PROTEIN: 7.7 g/dL (ref 6.0–8.3)
Total Bilirubin: 0.3 mg/dL (ref 0.2–1.2)

## 2015-10-21 LAB — MICROALBUMIN / CREATININE URINE RATIO
Creatinine,U: 205.7 mg/dL
Microalb Creat Ratio: 0.3 mg/g (ref 0.0–30.0)
Microalb, Ur: <0.7 mg/dL (ref 0.0–1.9)

## 2015-10-21 LAB — HEMOGLOBIN A1C: Hgb A1c MFr Bld: 6.7 % — ABNORMAL HIGH (ref 4.6–6.5)

## 2015-10-21 NOTE — Patient Instructions (Signed)
Call Dr ellison's office to get it with him.   We are checking the blood and urine today.   Get a meter and call us if you need strips.

## 2015-10-21 NOTE — Assessment & Plan Note (Signed)
He is taking lantus but not checking sugars. Not sure if he is taking his lisinopril but knows he is not taking both lisinopril and lipitor. Checking microalbumin to creatinine ratio and Hga1c and CMP. He will call and get back in the endocrinology. We discussed again the need for good control and reminded about specific complications from diabetes.

## 2015-10-21 NOTE — Progress Notes (Signed)
Pre visit review using our clinic review tool, if applicable. No additional management support is needed unless otherwise documented below in the visit note. 

## 2015-10-21 NOTE — Progress Notes (Signed)
   Subjective:    Patient ID: Justin Wilkinson, male    DOB: 08/02/1980, 35 y.o.   MRN: [id number]  HPI The patient is a 35 YO man coming in for follow up of his diabetes. He is a type 1 diabetic. He has not seen endocrinology since our last visit although we recommended that he talk to them about the insulin pump. He has not returned as directed for close follow up. He is taking the lantus but not checking his sugars at all. He is taking 1 pill additionally but does not know if it is lisinopril or lipitor. Denies any complaints or concerns. No new numbness on his feet. No increased thirst or urination.   Review of Systems  Constitutional: Negative for fever, activity change, appetite change, fatigue and unexpected weight change.  HENT: Negative.   Eyes: Negative.   Respiratory: Negative for cough, chest tightness, shortness of breath and wheezing.   Cardiovascular: Negative for chest pain, palpitations and leg swelling.  Gastrointestinal: Negative for nausea, abdominal pain, diarrhea, constipation and abdominal distention.  Musculoskeletal: Negative.   Skin: Negative.   Neurological: Negative.   Psychiatric/Behavioral: Negative.       Objective:   Physical Exam  Constitutional: He is oriented to person, place, and time. He appears well-developed and well-nourished.  HENT:  Head: Normocephalic and atraumatic.  Eyes: EOM are normal.  Neck: Normal range of motion.  Cardiovascular: Normal rate and regular rhythm.   Pulmonary/Chest: Effort normal and breath sounds normal. No respiratory distress. He has no wheezes. He has no rales.  Abdominal: Soft. Bowel sounds are normal. He exhibits no distension. There is no tenderness. There is no rebound.  Musculoskeletal: He exhibits no edema.  Neurological: He is alert and oriented to person, place, and time. Coordination normal.  Skin: Skin is warm and dry.  Psychiatric: He has a normal mood and affect.   Filed Vitals:   10/21/15 1305  BP:  112/74  Pulse: 76  Temp: 98.3 F (36.8 C)  TempSrc: Oral  Resp: 14  Height: 5\' 8"  (1.727 m)  Weight: 212 lb 6.4 oz (96.344 kg)  SpO2: 97%      Assessment & Plan:

## 2015-10-21 NOTE — Assessment & Plan Note (Signed)
BP is at goal and it is unclear if he is taking the lisinopril or not.

## 2016-07-27 ENCOUNTER — Other Ambulatory Visit (INDEPENDENT_AMBULATORY_CARE_PROVIDER_SITE_OTHER): Payer: BLUE CROSS/BLUE SHIELD

## 2016-07-27 ENCOUNTER — Encounter: Payer: Self-pay | Admitting: Internal Medicine

## 2016-07-27 ENCOUNTER — Ambulatory Visit (INDEPENDENT_AMBULATORY_CARE_PROVIDER_SITE_OTHER): Payer: BLUE CROSS/BLUE SHIELD | Admitting: Internal Medicine

## 2016-07-27 VITALS — BP 142/86 | HR 99 | Temp 98.1°F | Resp 14 | Ht 68.0 in | Wt 192.0 lb

## 2016-07-27 DIAGNOSIS — E108 Type 1 diabetes mellitus with unspecified complications: Secondary | ICD-10-CM | POA: Diagnosis not present

## 2016-07-27 DIAGNOSIS — E1065 Type 1 diabetes mellitus with hyperglycemia: Secondary | ICD-10-CM

## 2016-07-27 DIAGNOSIS — IMO0002 Reserved for concepts with insufficient information to code with codable children: Secondary | ICD-10-CM

## 2016-07-27 DIAGNOSIS — E785 Hyperlipidemia, unspecified: Secondary | ICD-10-CM | POA: Diagnosis not present

## 2016-07-27 LAB — CBC
HEMATOCRIT: 48.5 % (ref 39.0–52.0)
HEMOGLOBIN: 14.6 g/dL (ref 13.0–17.0)
MCHC: 30 g/dL (ref 30.0–36.0)
MCV: 90.2 fl (ref 78.0–100.0)
PLATELETS: NORMAL 10*3/uL — AB (ref 150.0–400.0)
RBC: 5.37 Mil/uL (ref 4.22–5.81)
RDW: 13.3 % (ref 11.5–15.5)
WBC: 8.5 10*3/uL (ref 4.0–10.5)

## 2016-07-27 LAB — LIPID PANEL
Cholesterol: 219 mg/dL — ABNORMAL HIGH (ref 0–200)
HDL: 33 mg/dL — ABNORMAL LOW (ref >39.00)
NonHDL: 185.67
Total CHOL/HDL Ratio: 7
Triglycerides: 390 mg/dL — ABNORMAL HIGH (ref 0.0–149.0)
VLDL: 78 mg/dL — ABNORMAL HIGH (ref 0.0–40.0)

## 2016-07-27 LAB — COMPREHENSIVE METABOLIC PANEL
ALBUMIN: 4.7 g/dL (ref 3.5–5.2)
ALK PHOS: 60 U/L (ref 39–117)
ALT: 15 U/L (ref 0–53)
AST: 11 U/L (ref 0–37)
BILIRUBIN TOTAL: 0.4 mg/dL (ref 0.2–1.2)
BUN: 13 mg/dL (ref 6–23)
CO2: 13 mEq/L — ABNORMAL LOW (ref 19–32)
Calcium: 9.4 mg/dL (ref 8.4–10.5)
Chloride: 99 mEq/L (ref 96–112)
Creatinine, Ser: 1.27 mg/dL (ref 0.40–1.50)
GFR: 82.39 mL/min (ref >60.00)
Glucose, Bld: 297 mg/dL — ABNORMAL HIGH (ref 70–99)
POTASSIUM: 4.6 meq/L (ref 3.5–5.1)
Sodium: 132 mEq/L — ABNORMAL LOW (ref 135–145)
TOTAL PROTEIN: 8.6 g/dL — AB (ref 6.0–8.3)

## 2016-07-27 LAB — MICROALBUMIN / CREATININE URINE RATIO
CREATININE, U: 41.5 mg/dL
MICROALB/CREAT RATIO: 17.9 mg/g (ref 0.0–30.0)
Microalb, Ur: 7.4 mg/dL — ABNORMAL HIGH (ref 0.0–1.9)

## 2016-07-27 LAB — HEMOGLOBIN A1C: Hgb A1c MFr Bld: 12 % — ABNORMAL HIGH (ref 4.6–6.5)

## 2016-07-27 LAB — LDL CHOLESTEROL, DIRECT: Direct LDL: 90 mg/dL

## 2016-07-27 MED ORDER — ATORVASTATIN CALCIUM 40 MG PO TABS: 40.0000 mg | ORAL_TABLET | Freq: Every day | ORAL | 3 refills | Status: DC

## 2016-07-27 MED ORDER — INSULIN GLARGINE 100 UNIT/ML SOLOSTAR PEN: 40.0000 [IU] | PEN_INJECTOR | SUBCUTANEOUS | 11 refills | Status: DC

## 2016-07-27 NOTE — Patient Instructions (Addendum)
We will check the labs today and check the sugars. We have sent in the refills.  You likely do need an eye exam to make sure the diabetes is not affecting the eyes.   Diabetes Mellitus and Standards of Medical Care Managing diabetes (diabetes mellitus) can be complicated. Your diabetes treatment may be managed by a team of health care providers, including:  A diet and nutrition specialist (registered dietitian).  A nurse.  A certified diabetes educator (CDE).  A diabetes specialist (endocrinologist).  An eye doctor.  A primary care provider.  A dentist. Your health care providers follow a schedule in order to help you get the best quality of care. The following schedule is a general guideline for your diabetes management plan. Your health care providers may also give you more specific instructions. HbA1c ( hemoglobin A1c) test This test provides information about blood sugar (glucose) control over the previous 2-3 months. It is used to check whether your diabetes management plan needs to be adjusted.  If you are meeting your treatment goals, this test is done at least 2 times a year.  If you are not meeting treatment goals or if your treatment goals have changed, this test is done 4 times a year. Blood pressure test  This test is done at every routine medical visit. For most people, the goal is less than 130/80. Ask your health care provider what your goal blood pressure should be. Dental and eye exams  Visit your dentist two times a year.  If you have type 1 diabetes, get an eye exam 3-5 years after you are diagnosed, and then once a year after your first exam.  If you were diagnosed with type 1 diabetes as a child, get an eye exam when you are age 63 or older and have had diabetes for 3-5 years. After the first exam, you should get an eye exam once a year.  If you have type 2 diabetes, have an eye exam as soon as you are diagnosed, and then once a year after your first  exam. Foot care exam  Visual foot exams are done at every routine medical visit. The exams check for cuts, bruises, redness, blisters, sores, or other problems with the feet.  A complete foot exam is done by your health care provider once a year. This exam includes an inspection of the structure and skin of your feet, and a check of the pulses and sensation in your feet.  Type 1 diabetes: Get your first exam 3-5 years after diagnosis.  Type 2 diabetes: Get your first exam as soon as you are diagnosed.  Check your feet every day for cuts, bruises, redness, blisters, or sores. If you have any of these or other problems that are not healing, contact your health care provider. Kidney function test ( urine microalbumin)  This test is done once a year.  Type 1 diabetes: Get your first test 5 years after diagnosis.  Type 2 diabetes: Get your first test as soon as you are diagnosed.  If you have chronic kidney disease (CKD), get a serum creatinine and estimated glomerular filtration rate (eGFR) test once a year. Lipid profile (cholesterol, HDL, LDL, triglycerides)  This test should be done when you are diagnosed with diabetes, and every 5 years after the first test. If you are on medicines to lower your cholesterol, you may need to get this test done every year.  The goal for LDL is less than 100 mg/dL (5.5 mmol/L). If  you are at high risk, the goal is less than 70 mg/dL (3.9 mmol/L).    The goal for triglycerides is less than 150 mg/dL (8.3 mmol/L). Immunizations  The yearly flu (influenza) vaccine is recommended for everyone 6 months or older who has diabetes.  The pneumonia (pneumococcal) vaccine is recommended for everyone 2 years or older who has diabetes. If you are 31 or older, you may get the pneumonia vaccine as a series of two separate shots.  The hepatitis B vaccine is recommended for adults shortly after they have been diagnosed with diabetes.     Mental and emotional  health  Screening for symptoms of eating disorders, anxiety, and depression is recommended at the time of diagnosis and afterward as needed. If your screening shows that you have symptoms (you have a positive screening result), you may need further evaluation and be referred to a mental health care provider. Diabetes self-management education  Education about how to manage your diabetes is recommended at diagnosis and ongoing as needed. Treatment plan  Your treatment plan will be reviewed at every medical visit. Summary  Managing diabetes (diabetes mellitus) can be complicated. Your diabetes treatment may be managed by a team of health care providers.  Your health care providers follow a schedule in order to help you get the best quality of care.  Standards of care including having regular physical exams, blood tests, blood pressure monitoring, immunizations, screening tests, and education about how to manage your diabetes.  Your health care providers may also give you more specific instructions based on your individual health. This information is not intended to replace advice given to you by your health care provider. Make sure you discuss any questions you have with your health care provider. Document Released: 02/15/2009 Document Revised: 01/17/2016 Document Reviewed: 01/17/2016 Elsevier Interactive Patient Education  2017 [employer].

## 2016-07-27 NOTE — Progress Notes (Signed)
Pre visit review using our clinic review tool, if applicable. No additional management support is needed unless otherwise documented below in the visit note. 

## 2016-07-27 NOTE — Progress Notes (Signed)
   Subjective:    Patient ID: Justin Wilkinson, male    DOB: [birth year], 36 y.o.   MRN: [id number]  HPI The patient is a 36 YO man coming in for follow up of his type 1 diabetes. He has been without insulin for about 1 month for some reason and did not call for refills. His sugar was running in the high 400s last week and this morning was 260 fasting. He previously was taking lantus 40 units daily without problems and no low sugars. Morning sugars were running around 80-100. He is feeling some appetite reduction and abdominal discomfort. He is also having some drowsiness last week but better today. Still drinking fluids well. Mild headaches as well.   Review of Systems  Constitutional: Positive for activity change, appetite change and fatigue. Negative for chills, fever and unexpected weight change.  Respiratory: Negative.   Cardiovascular: Negative.   Gastrointestinal: Positive for abdominal pain and nausea. Negative for abdominal distention, anal bleeding, blood in stool, constipation, diarrhea, rectal pain and vomiting.  Musculoskeletal: Negative.   Skin: Negative.   Neurological: Negative.       Objective:   Physical Exam  Constitutional: He is oriented to person, place, and time. He appears well-developed and well-nourished.  HENT:  Head: Normocephalic and atraumatic.  Eyes: EOM are normal.  Neck: Normal range of motion.  Cardiovascular: Normal rate and regular rhythm.   Pulmonary/Chest: Effort normal.  Abdominal: Soft. He exhibits no distension. There is no tenderness. There is no rebound.  Musculoskeletal: He exhibits no edema.  Neurological: He is alert and oriented to person, place, and time.  Skin: Skin is warm and dry.   Vitals:   07/27/16 1254 07/27/16 1318  BP: (!) 150/90 (!) 142/86  Pulse: 99   Resp: 14   Temp: 98.1 F (36.7 C)   TempSrc: Oral   SpO2: 100%   Weight: 192 lb (87.1 kg)   Height: 5\' 8"  (1.727 m)       Assessment & Plan:

## 2016-07-27 NOTE — Assessment & Plan Note (Signed)
With hyperglycemia. He is not taking his lantus for about 1 month. Foot exam done, checking HgA1c, lipid, CMP, microalbumin to creatinine ratio. Not on ACE-I but on statin (also out for 1 month). Has not returned to endo as advised and reminded him again this would be beneficial.

## 2016-08-18 ENCOUNTER — Encounter: Payer: Self-pay | Admitting: Endocrinology

## 2016-08-18 ENCOUNTER — Ambulatory Visit (INDEPENDENT_AMBULATORY_CARE_PROVIDER_SITE_OTHER): Payer: BLUE CROSS/BLUE SHIELD | Admitting: Endocrinology

## 2016-08-18 VITALS — BP 126/84 | HR 92 | Ht 68.0 in | Wt 200.0 lb

## 2016-08-18 DIAGNOSIS — E1065 Type 1 diabetes mellitus with hyperglycemia: Secondary | ICD-10-CM

## 2016-08-18 DIAGNOSIS — E108 Type 1 diabetes mellitus with unspecified complications: Secondary | ICD-10-CM | POA: Diagnosis not present

## 2016-08-18 DIAGNOSIS — IMO0002 Reserved for concepts with insufficient information to code with codable children: Secondary | ICD-10-CM

## 2016-08-18 NOTE — Progress Notes (Signed)
Subjective:    Patient ID: Justin Wilkinson, male    DOB: October 06, 1980, 36 y.o.   MRN: [id number]  HPI Pt returns for f/u of diabetes mellitus: DM type: 1 Dx'ed: 2014 Complications: none. Therapy: insulin since dx. DKA: only at dx. Severe hypoglycemia: never Pancreatitis: never Other: he declines multiple daily injections Interval history:  He says recent a1c was high because the pharmacy did not call his about refill.  Since back on insulin, cbg's vary from 96-125, and he feels better in general.   Past Medical History:  Diagnosis Date  . Diabetes mellitus without complication (HCC)   . Hyperlipidemia   . Hypertension     No past surgical history on file.  Social History   Social History  . Marital status: Single    Spouse name: N/A  . Number of children: N/A  . Years of education: N/A   Occupational History  . Not on file.   Social History Main Topics  . Smoking status: Former Smoker    Packs/day: 0.50    Types: Cigarettes    Quit date: 12/15/2012  . Smokeless tobacco: Never Used  . Alcohol use No  . Drug use: No  . Sexual activity: No   Other Topics Concern  . Not on file   Social History Narrative  . No narrative on file    Current Outpatient Prescriptions on File Prior to Visit  Medication Sig Dispense Refill  . Alcohol Swabs PADS 1 Container by Does not apply route 2 (two) times daily. 100 each 12  . atorvastatin (LIPITOR) 40 MG tablet Take 1 tablet (40 mg total) by mouth daily. 90 tablet 3  . B-D UF III MINI PEN NEEDLES 31G X 5 MM MISC daily.  11  . FINGERSTIX LANCETS MISC 1 Container by Does not apply route 2 (two) times daily. 200 each 2  . glucose blood (ACCU-CHEK SMARTVIEW) test strip 1 each by Other route 2 (two) times daily. And lancets 2/day 100 each 12  . Insulin Glargine (LANTUS SOLOSTAR) 100 UNIT/ML Solostar Pen Inject 40 Units into the skin every morning. And pen needles 1/day 15 mL 11   No current facility-administered medications on file  prior to visit.     Allergies  Allergen Reactions  . Fish Allergy Anaphylaxis  . Shellfish Allergy Anaphylaxis    Family History  Problem Relation Age of Onset  . Lung cancer Father   . Diabetes Maternal Grandmother     BP 126/84   Pulse 92   Ht 5\' 8"  (1.727 m)   Wt 200 lb (90.7 kg)   SpO2 95%   BMI 30.41 kg/m    Review of Systems He denies hypoglycemia.      Objective:   Physical Exam VITAL SIGNS:  See vs page GENERAL: no distress Pulses: dorsalis pedis intact bilat.   MSK: no deformity of the feet CV: no leg edema Skin:  no ulcer on the feet.  normal color and temp on the feet. Neuro: sensation is intact to touch on the feet  Lab Results  Component Value Date   HGBA1C 12.0 (H) 07/27/2016       Assessment & Plan:  Type 1 DM: apparently better back on rx.  Please continue the same insulin.  Check fructosamine.   Patient Instructions  check your blood sugar twice a day: before the 3 meals, and at bedtime.  also check if you have symptoms of your blood sugar being too high or too low.  please keep a record of the readings and bring it to your next appointment here.  You can write it on any piece of paper.  please call us sooner if your blood sugar goes below 70, or if you have a lot of readings over 200.   blood tests are requested for you today.  We'll let you know about the results. Please come back for a follow-up appointment in 3 months.

## 2016-08-18 NOTE — Patient Instructions (Addendum)
check your blood sugar twice a day: before the 3 meals, and at bedtime.  also check if you have symptoms of your blood sugar being too high or too low.  please keep a record of the readings and bring it to your next appointment here.  You can write it on any piece of paper.  please call us sooner if your blood sugar goes below 70, or if you have a lot of readings over 200.   blood tests are requested for you today.  We'll let you know about the results. Please come back for a follow-up appointment in 3 months.     

## 2016-08-20 LAB — FRUCTOSAMINE: FRUCTOSAMINE: 638 umol/L — AB (ref 190–270)

## 2016-10-06 ENCOUNTER — Other Ambulatory Visit: Payer: Self-pay | Admitting: Internal Medicine

## 2016-11-17 ENCOUNTER — Ambulatory Visit (INDEPENDENT_AMBULATORY_CARE_PROVIDER_SITE_OTHER): Payer: BLUE CROSS/BLUE SHIELD | Admitting: Endocrinology

## 2016-11-17 ENCOUNTER — Encounter: Payer: Self-pay | Admitting: Endocrinology

## 2016-11-17 VITALS — BP 122/84 | HR 88 | Ht 68.0 in | Wt 205.0 lb

## 2016-11-17 DIAGNOSIS — IMO0002 Reserved for concepts with insufficient information to code with codable children: Secondary | ICD-10-CM

## 2016-11-17 DIAGNOSIS — E108 Type 1 diabetes mellitus with unspecified complications: Secondary | ICD-10-CM

## 2016-11-17 DIAGNOSIS — E1065 Type 1 diabetes mellitus with hyperglycemia: Secondary | ICD-10-CM | POA: Diagnosis not present

## 2016-11-17 LAB — POCT GLYCOSYLATED HEMOGLOBIN (HGB A1C): Hemoglobin A1C: 11

## 2016-11-17 MED ORDER — INSULIN GLARGINE 100 UNIT/ML SOLOSTAR PEN: 50.0000 [IU] | PEN_INJECTOR | SUBCUTANEOUS | 11 refills | Status: DC

## 2016-11-17 NOTE — Progress Notes (Signed)
Subjective:    Patient ID: Justin Wilkinson, male    DOB: 26-Aug-1980, 36 y.o.   MRN: [id number]  HPI Pt returns for f/u of diabetes mellitus: DM type: 1 Dx'ed: 2014 Complications: none. Therapy: insulin since dx. DKA: only at dx. Severe hypoglycemia: never Pancreatitis: never Other: he declines multiple daily injections.   Interval history:  Pt says he never misses the insulin.  Since back on insulin, cbg's are in the low-100's, and he feels better in general.  Past Medical History:  Diagnosis Date  . Diabetes mellitus without complication (HCC)   . Hyperlipidemia   . Hypertension     No past surgical history on file.  Social History   Social History  . Marital status: Single    Spouse name: N/A  . Number of children: N/A  . Years of education: N/A   Occupational History  . Not on file.   Social History Main Topics  . Smoking status: Former Smoker    Packs/day: 0.50    Types: Cigarettes    Quit date: 12/15/2012  . Smokeless tobacco: Never Used  . Alcohol use No  . Drug use: No  . Sexual activity: No   Other Topics Concern  . Not on file   Social History Narrative  . No narrative on file    Current Outpatient Prescriptions on File Prior to Visit  Medication Sig Dispense Refill  . Alcohol Swabs PADS 1 Container by Does not apply route 2 (two) times daily. 100 each 12  . atorvastatin (LIPITOR) 40 MG tablet Take 1 tablet (40 mg total) by mouth daily. 90 tablet 3  . B-D UF III MINI PEN NEEDLES 31G X 5 MM MISC USE ONCE DAILY 100 each 3  . FINGERSTIX LANCETS MISC 1 Container by Does not apply route 2 (two) times daily. 200 each 2  . glucose blood (ACCU-CHEK SMARTVIEW) test strip 1 each by Other route 2 (two) times daily. And lancets 2/day 100 each 12   No current facility-administered medications on file prior to visit.     Allergies  Allergen Reactions  . Fish Allergy Anaphylaxis  . Shellfish Allergy Anaphylaxis    Family History  Problem Relation Age of  Onset  . Lung cancer Father   . Diabetes Maternal Grandmother     BP 122/84   Pulse 88   Ht 5\' 8"  (1.727 m)   Wt 205 lb (93 kg)   SpO2 95%   BMI 31.17 kg/m    Review of Systems He denies hypoglycemia    Objective:   Physical Exam VITAL SIGNS:  See vs page GENERAL: no distress Pulses: foot pulses are intact bilaterally.   MSK: no deformity of the feet or ankles.  CV: no edema of the legs or ankles Skin:  no ulcer on the feet or ankles.  normal color and temp on the feet and ankles Neuro: sensation is intact to touch on the feet and ankles.     A1c=11.0%    Assessment & Plan:  Type 1 DM: ongoing poor glycemic control.   Patient Instructions  check your blood sugar twice a day: before the 3 meals, and at bedtime.  also check if you have symptoms of your blood sugar being too high or too low.  please keep a record of the readings and bring it to your next appointment here.  You can write it on any piece of paper.  please call us sooner if your blood sugar goes below 70,  or if you have a lot of readings over 200.   I have sent a prescription to your pharmacy, to increase the insulin to 50 units each morning.   Please come back for a follow-up appointment in 3 months.

## 2016-11-17 NOTE — Patient Instructions (Addendum)
check your blood sugar twice a day: before the 3 meals, and at bedtime.  also check if you have symptoms of your blood sugar being too high or too low.  please keep a record of the readings and bring it to your next appointment here.  You can write it on any piece of paper.  please call us sooner if your blood sugar goes below 70, or if you have a lot of readings over 200.   I have sent a prescription to your pharmacy, to increase the insulin to 50 units each morning.   Please come back for a follow-up appointment in 3 months.

## 2017-02-16 ENCOUNTER — Ambulatory Visit: Payer: BLUE CROSS/BLUE SHIELD | Admitting: Endocrinology

## 2017-03-16 ENCOUNTER — Encounter: Payer: Self-pay | Admitting: Endocrinology

## 2017-03-16 ENCOUNTER — Ambulatory Visit: Payer: BLUE CROSS/BLUE SHIELD | Admitting: Endocrinology

## 2017-03-16 VITALS — BP 127/86 | HR 78 | Wt 220.6 lb

## 2017-03-16 DIAGNOSIS — IMO0002 Reserved for concepts with insufficient information to code with codable children: Secondary | ICD-10-CM

## 2017-03-16 DIAGNOSIS — E1065 Type 1 diabetes mellitus with hyperglycemia: Secondary | ICD-10-CM

## 2017-03-16 DIAGNOSIS — E108 Type 1 diabetes mellitus with unspecified complications: Secondary | ICD-10-CM | POA: Diagnosis not present

## 2017-03-16 LAB — POCT GLYCOSYLATED HEMOGLOBIN (HGB A1C): HEMOGLOBIN A1C: 7.2

## 2017-03-16 NOTE — Patient Instructions (Addendum)
check your blood sugar twice a day: before the 3 meals, and at bedtime.  also check if you have symptoms of your blood sugar being too high or too low.  please keep a record of the readings and bring it to your next appointment here.  You can write it on any piece of paper.  please call us sooner if your blood sugar goes below 70, or if you have a lot of readings over 200.   Please come back for a follow-up appointment in 4 months.

## 2017-03-16 NOTE — Progress Notes (Signed)
Subjective:    Patient ID: Justin Wilkinson, male    DOB: February 15, 1981, 36 y.o.   MRN: [id number]  HPI Pt returns for f/u of diabetes mellitus: DM type: 1 Dx'ed: 2014 Complications: none. Therapy: insulin since dx. DKA: only at dx. Severe hypoglycemia: never Pancreatitis: never.  Other: he declines multiple daily injections.   Interval history:  Pt says he never misses the insulin.  Since back on insulin, cbg's are well-controlled.  he feels better in general.   Past Medical History:  Diagnosis Date  . Diabetes mellitus without complication (HCC)   . Hyperlipidemia   . Hypertension     History reviewed. No pertinent surgical history.  Social History   Socioeconomic History  . Marital status: Single    Spouse name: Not on file  . Number of children: Not on file  . Years of education: Not on file  . Highest education level: Not on file  Social Needs  . Financial resource strain: Not on file  . Food insecurity - worry: Not on file  . Food insecurity - inability: Not on file  . Transportation needs - medical: Not on file  . Transportation needs - non-medical: Not on file  Occupational History  . Not on file  Tobacco Use  . Smoking status: Former Smoker    Packs/day: 0.50    Types: Cigarettes    Last attempt to quit: 12/15/2012    Years since quitting: 4.2  . Smokeless tobacco: Never Used  Substance and Sexual Activity  . Alcohol use: No  . Drug use: No  . Sexual activity: No  Other Topics Concern  . Not on file  Social History Narrative  . Not on file    Current Outpatient Medications on File Prior to Visit  Medication Sig Dispense Refill  . Alcohol Swabs PADS 1 Container by Does not apply route 2 (two) times daily. 100 each 12  . atorvastatin (LIPITOR) 40 MG tablet Take 1 tablet (40 mg total) by mouth daily. 90 tablet 3  . B-D UF III MINI PEN NEEDLES 31G X 5 MM MISC USE ONCE DAILY 100 each 3  . FINGERSTIX LANCETS MISC 1 Container by Does not apply route 2 (two)  times daily. 200 each 2  . glucose blood (ACCU-CHEK SMARTVIEW) test strip 1 each by Other route 2 (two) times daily. And lancets 2/day 100 each 12  . Insulin Glargine (LANTUS SOLOSTAR) 100 UNIT/ML Solostar Pen Inject 50 Units into the skin every morning. And pen needles 1/day 10 pen 11   No current facility-administered medications on file prior to visit.     Allergies  Allergen Reactions  . Fish Allergy Anaphylaxis  . Shellfish Allergy Anaphylaxis    Family History  Problem Relation Age of Onset  . Lung cancer Father   . Diabetes Maternal Grandmother     BP 127/86 (BP Location: Right Arm, Patient Position: Sitting)   Pulse 78   Wt 220 lb 9.6 oz (100.1 kg)   SpO2 98%   BMI 33.54 kg/m    Review of Systems He denies hypoglycemia.      Objective:   Physical Exam VITAL SIGNS:  See vs page GENERAL: no distress Pulses: foot pulses are intact bilaterally.   MSK: no deformity of the feet or ankles.  CV: no edema of the legs or ankles Skin:  no ulcer on the feet or ankles.  normal color and temp on the feet and ankles Neuro: sensation is intact to touch on  the feet and ankles.    Lab Results  Component Value Date   HGBA1C 7.2 03/16/2017       Assessment & Plan:  Type 1 DM: this is the best control this pt should aim for, given this regimen, which does match insulin to his changing needs throughout the day.  Patient Instructions  check your blood sugar twice a day: before the 3 meals, and at bedtime.  also check if you have symptoms of your blood sugar being too high or too low.  please keep a record of the readings and bring it to your next appointment here.  You can write it on any piece of paper.  please call us sooner if your blood sugar goes below 70, or if you have a lot of readings over 200.   Please come back for a follow-up appointment in 4 months.

## 2017-07-12 ENCOUNTER — Ambulatory Visit: Payer: BLUE CROSS/BLUE SHIELD | Admitting: Endocrinology

## 2017-07-12 ENCOUNTER — Encounter: Payer: Self-pay | Admitting: Endocrinology

## 2017-07-12 VITALS — BP 138/90 | HR 82 | Wt 211.8 lb

## 2017-07-12 DIAGNOSIS — E108 Type 1 diabetes mellitus with unspecified complications: Secondary | ICD-10-CM | POA: Diagnosis not present

## 2017-07-12 DIAGNOSIS — IMO0002 Reserved for concepts with insufficient information to code with codable children: Secondary | ICD-10-CM

## 2017-07-12 DIAGNOSIS — E1065 Type 1 diabetes mellitus with hyperglycemia: Secondary | ICD-10-CM

## 2017-07-12 LAB — POCT GLYCOSYLATED HEMOGLOBIN (HGB A1C): Hemoglobin A1C: 12.3

## 2017-07-12 NOTE — Patient Instructions (Addendum)
check your blood sugar twice a day: before the 3 meals, and at bedtime.  also check if you have symptoms of your blood sugar being too high or too low.  please keep a record of the readings and bring it to your next appointment here.  You can write it on any piece of paper.  please call us sooner if your blood sugar goes below 70, or if you have a lot of readings over 200.   Please continue the same insulin.   Please come back for a follow-up appointment in 2 months.

## 2017-07-12 NOTE — Progress Notes (Signed)
Subjective:    Patient ID: Justin Wilkinson, male    DOB: Sep 11, 1980, 37 y.o.   MRN: [id number]  HPI Pt returns for f/u of diabetes mellitus: DM type: 1 Dx'ed: 2014 Complications: none. Therapy: insulin since dx. DKA: only at dx. Severe hypoglycemia: never.  Pancreatitis: never.  Other: he declines multiple daily injections.   Interval history:  Pt says he never misses the insulin.  no cbg record, but states cbg's are "high."  he feels better in general.   Past Medical History:  Diagnosis Date  . Diabetes mellitus without complication (HCC)   . Hyperlipidemia   . Hypertension     History reviewed. No pertinent surgical history.  Social History   Socioeconomic History  . Marital status: Single    Spouse name: Not on file  . Number of children: Not on file  . Years of education: Not on file  . Highest education level: Not on file  Social Needs  . Financial resource strain: Not on file  . Food insecurity - worry: Not on file  . Food insecurity - inability: Not on file  . Transportation needs - medical: Not on file  . Transportation needs - non-medical: Not on file  Occupational History  . Not on file  Tobacco Use  . Smoking status: Former Smoker    Packs/day: 0.50    Types: Cigarettes    Last attempt to quit: 12/15/2012    Years since quitting: 4.5  . Smokeless tobacco: Never Used  Substance and Sexual Activity  . Alcohol use: No  . Drug use: No  . Sexual activity: No  Other Topics Concern  . Not on file  Social History Narrative  . Not on file    Current Outpatient Medications on File Prior to Visit  Medication Sig Dispense Refill  . Alcohol Swabs PADS 1 Container by Does not apply route 2 (two) times daily. 100 each 12  . atorvastatin (LIPITOR) 40 MG tablet Take 1 tablet (40 mg total) by mouth daily. 90 tablet 3  . B-D UF III MINI PEN NEEDLES 31G X 5 MM MISC USE ONCE DAILY 100 each 3  . FINGERSTIX LANCETS MISC 1 Container by Does not apply route 2 (two)  times daily. 200 each 2  . glucose blood (ACCU-CHEK SMARTVIEW) test strip 1 each by Other route 2 (two) times daily. And lancets 2/day 100 each 12  . Insulin Glargine (LANTUS SOLOSTAR) 100 UNIT/ML Solostar Pen Inject 50 Units into the skin every morning. And pen needles 1/day 10 pen 11   No current facility-administered medications on file prior to visit.     Allergies  Allergen Reactions  . Fish Allergy Anaphylaxis  . Shellfish Allergy Anaphylaxis    Family History  Problem Relation Age of Onset  . Lung cancer Father   . Diabetes Maternal Grandmother     BP 138/90 (BP Location: Left Arm, Patient Position: Sitting, Cuff Size: Normal)   Pulse 82   Wt 211 lb 12.8 oz (96.1 kg)   SpO2 98%   BMI 32.20 kg/m    Review of Systems He denies hypoglycemia    Objective:   Physical Exam VITAL SIGNS:  See vs page GENERAL: no distress Pulses: dorsalis pedis intact bilat.   MSK: no deformity of the feet CV: no leg edema Skin:  no ulcer on the feet.  normal color and temp on the feet. Neuro: sensation is intact to touch on the feet.    Lab Results  Component  Value Date   HGBA1C 12.3 07/12/2017       Assessment & Plan:  type 1 DM: worse, usually due to noncompliance. We discussed.  He declines to change to tresiba.    Patient Instructions  check your blood sugar twice a day: before the 3 meals, and at bedtime.  also check if you have symptoms of your blood sugar being too high or too low.  please keep a record of the readings and bring it to your next appointment here.  You can write it on any piece of paper.  please call us sooner if your blood sugar goes below 70, or if you have a lot of readings over 200.   Please continue the same insulin.   Please come back for a follow-up appointment in 2 months.

## 2017-09-13 ENCOUNTER — Ambulatory Visit: Payer: BLUE CROSS/BLUE SHIELD | Admitting: Endocrinology

## 2017-10-25 ENCOUNTER — Ambulatory Visit: Payer: BLUE CROSS/BLUE SHIELD | Admitting: Endocrinology

## 2017-11-16 ENCOUNTER — Other Ambulatory Visit: Payer: Self-pay | Admitting: Internal Medicine

## 2017-12-13 ENCOUNTER — Other Ambulatory Visit: Payer: Self-pay | Admitting: Endocrinology

## 2017-12-20 ENCOUNTER — Encounter: Payer: Self-pay | Admitting: Endocrinology

## 2017-12-20 ENCOUNTER — Ambulatory Visit: Payer: BLUE CROSS/BLUE SHIELD | Admitting: Endocrinology

## 2017-12-20 VITALS — BP 120/80 | HR 96 | Temp 98.0°F | Ht 68.0 in | Wt 209.0 lb

## 2017-12-20 DIAGNOSIS — E1065 Type 1 diabetes mellitus with hyperglycemia: Secondary | ICD-10-CM | POA: Diagnosis not present

## 2017-12-20 DIAGNOSIS — E108 Type 1 diabetes mellitus with unspecified complications: Secondary | ICD-10-CM

## 2017-12-20 DIAGNOSIS — IMO0002 Reserved for concepts with insufficient information to code with codable children: Secondary | ICD-10-CM

## 2017-12-20 LAB — POCT GLYCOSYLATED HEMOGLOBIN (HGB A1C): Hemoglobin A1C: 6.3 % — AB (ref 4.0–5.6)

## 2017-12-20 MED ORDER — INSULIN GLARGINE 100 UNIT/ML SOLOSTAR PEN
45.0000 [IU] | PEN_INJECTOR | SUBCUTANEOUS | 11 refills | Status: DC
Start: 2017-12-20 — End: 2018-04-04

## 2017-12-20 MED ORDER — GLUCOSE BLOOD VI STRP: 1.0000 | ORAL_STRIP | Freq: Four times a day (QID) | 12 refills | Status: DC

## 2017-12-20 NOTE — Progress Notes (Signed)
Subjective:    Patient ID: Justin Wilkinson, male    DOB: 29-Dec-1980, 37 y.o.   MRN: [id number]  HPI Pt returns for f/u of diabetes mellitus: DM type: 1 Dx'ed: 2014 Complications: none. Therapy: insulin since dx. DKA: only at dx. Severe hypoglycemia: never.  Pancreatitis: never.  Other: he declines multiple daily injections.   Interval history:  Pt says he never misses the insulin, but he does not check cbg's.  pt states he feels well in general. Past Medical History:  Diagnosis Date  . Diabetes mellitus without complication (HCC)   . Hyperlipidemia   . Hypertension     No past surgical history on file.  Social History   Socioeconomic History  . Marital status: Single    Spouse name: Not on file  . Number of children: Not on file  . Years of education: Not on file  . Highest education level: Not on file  Occupational History  . Not on file  Social Needs  . Financial resource strain: Not on file  . Food insecurity:    Worry: Not on file    Inability: Not on file  . Transportation needs:    Medical: Not on file    Non-medical: Not on file  Tobacco Use  . Smoking status: Former Smoker    Packs/day: 0.50    Types: Cigarettes    Last attempt to quit: 12/15/2012    Years since quitting: 5.0  . Smokeless tobacco: Never Used  Substance and Sexual Activity  . Alcohol use: No  . Drug use: No  . Sexual activity: Never  Lifestyle  . Physical activity:    Days per week: Not on file    Minutes per session: Not on file  . Stress: Not on file  Relationships  . Social connections:    Talks on phone: Not on file    Gets together: Not on file    Attends religious service: Not on file    Active member of club or organization: Not on file    Attends meetings of clubs or organizations: Not on file    Relationship status: Not on file  . Intimate partner violence:    Fear of current or ex partner: Not on file    Emotionally abused: Not on file    Physically abused: Not on  file    Forced sexual activity: Not on file  Other Topics Concern  . Not on file  Social History Narrative  . Not on file    Current Outpatient Medications on File Prior to Visit  Medication Sig Dispense Refill  . Alcohol Swabs PADS 1 Container by Does not apply route 2 (two) times daily. 100 each 12  . atorvastatin (LIPITOR) 40 MG tablet Take 1 tablet (40 mg total) by mouth daily. 90 tablet 3  . FINGERSTIX LANCETS MISC 1 Container by Does not apply route 2 (two) times daily. 200 each 2   No current facility-administered medications on file prior to visit.     Allergies  Allergen Reactions  . Fish Allergy Anaphylaxis  . Shellfish Allergy Anaphylaxis    Family History  Problem Relation Age of Onset  . Lung cancer Father   . Diabetes Maternal Grandmother     BP 120/80 (BP Location: Right Arm, Patient Position: Sitting, Cuff Size: Normal)   Pulse 96   Temp 98 F (36.7 C) (Oral)   Ht 5\' 8"  (1.727 m)   Wt 209 lb (94.8 kg)   SpO2 98%  BMI 31.78 kg/m    Review of Systems He denies hypoglycemia    Objective:   Physical Exam VITAL SIGNS:  See vs page GENERAL: no distress Pulses: foot pulses are intact bilaterally.   MSK: no deformity of the feet or ankles.  CV: no edema of the legs or ankles Skin:  no ulcer on the feet or ankles.  normal color and temp on the feet and ankles Neuro: sensation is intact to touch on the feet and ankles.    A1c=6.3%     Assessment & Plan:  Type 1 DM: overcontrolled.    Patient Instructions  check your blood sugar 4 times a day: before the 3 meals, and at bedtime.  also check if you have symptoms of your blood sugar being too high or too low.  please keep a record of the readings and bring it to your next appointment here (or you can bring the meter itself).  You can write it on any piece of paper.  please call us sooner if your blood sugar goes below 70, or if you have a lot of readings over 200.   Here is a new meter.  I have sent a  prescription to your pharmacy, for strips.   Please reduce the insulin to 45 units each morning Please come back for a follow-up appointment in 3 months.

## 2017-12-20 NOTE — Patient Instructions (Addendum)
check your blood sugar 4 times a day: before the 3 meals, and at bedtime.  also check if you have symptoms of your blood sugar being too high or too low.  please keep a record of the readings and bring it to your next appointment here (or you can bring the meter itself).  You can write it on any piece of paper.  please call us sooner if your blood sugar goes below 70, or if you have a lot of readings over 200.   Here is a new meter.  I have sent a prescription to your pharmacy, for strips.   Please reduce the insulin to 45 units each morning Please come back for a follow-up appointment in 3 months.

## 2018-03-28 ENCOUNTER — Ambulatory Visit: Payer: BLUE CROSS/BLUE SHIELD | Admitting: Endocrinology

## 2018-04-04 ENCOUNTER — Ambulatory Visit (INDEPENDENT_AMBULATORY_CARE_PROVIDER_SITE_OTHER): Payer: BLUE CROSS/BLUE SHIELD | Admitting: Endocrinology

## 2018-04-04 ENCOUNTER — Encounter: Payer: Self-pay | Admitting: Endocrinology

## 2018-04-04 VITALS — BP 145/98 | HR 84 | Ht 68.0 in | Wt 222.6 lb

## 2018-04-04 DIAGNOSIS — E1065 Type 1 diabetes mellitus with hyperglycemia: Secondary | ICD-10-CM | POA: Diagnosis not present

## 2018-04-04 DIAGNOSIS — IMO0002 Reserved for concepts with insufficient information to code with codable children: Secondary | ICD-10-CM

## 2018-04-04 DIAGNOSIS — E108 Type 1 diabetes mellitus with unspecified complications: Secondary | ICD-10-CM

## 2018-04-04 LAB — POCT GLYCOSYLATED HEMOGLOBIN (HGB A1C): Hemoglobin A1C: 7.5 % — AB (ref 4.0–5.6)

## 2018-04-04 MED ORDER — INSULIN GLARGINE 100 UNIT/ML SOLOSTAR PEN: 45.0000 [IU] | PEN_INJECTOR | SUBCUTANEOUS | 11 refills | Status: DC

## 2018-04-04 NOTE — Progress Notes (Signed)
Subjective:    Patient ID: Justin Wilkinson, male    DOB: 04-18-1981, 37 y.o.   MRN: [id number]  HPI Pt returns for f/u of diabetes mellitus: DM type: 1 Dx'ed: 2014 Complications: none. Therapy: insulin since dx. DKA: only at dx. Severe hypoglycemia: never.  Pancreatitis: never.  Other: he declines multiple daily injections.   Interval history:  Pt says he never misses the insulin, but he does not check cbg's.  pt states he feels well in general. Past Medical History:  Diagnosis Date  . Diabetes mellitus without complication (HCC)   . Hyperlipidemia   . Hypertension     No past surgical history on file.  Social History   Socioeconomic History  . Marital status: Single    Spouse name: Not on file  . Number of children: Not on file  . Years of education: Not on file  . Highest education level: Not on file  Occupational History  . Not on file  Social Needs  . Financial resource strain: Not on file  . Food insecurity:    Worry: Not on file    Inability: Not on file  . Transportation needs:    Medical: Not on file    Non-medical: Not on file  Tobacco Use  . Smoking status: Former Smoker    Packs/day: 0.50    Types: Cigarettes    Last attempt to quit: 12/15/2012    Years since quitting: 5.3  . Smokeless tobacco: Never Used  Substance and Sexual Activity  . Alcohol use: No  . Drug use: No  . Sexual activity: Never  Lifestyle  . Physical activity:    Days per week: Not on file    Minutes per session: Not on file  . Stress: Not on file  Relationships  . Social connections:    Talks on phone: Not on file    Gets together: Not on file    Attends religious service: Not on file    Active member of club or organization: Not on file    Attends meetings of clubs or organizations: Not on file    Relationship status: Not on file  . Intimate partner violence:    Fear of current or ex partner: Not on file    Emotionally abused: Not on file    Physically abused: Not on  file    Forced sexual activity: Not on file  Other Topics Concern  . Not on file  Social History Narrative  . Not on file    Current Outpatient Medications on File Prior to Visit  Medication Sig Dispense Refill  . Alcohol Swabs PADS 1 Container by Does not apply route 2 (two) times daily. 100 each 12  . atorvastatin (LIPITOR) 40 MG tablet Take 1 tablet (40 mg total) by mouth daily. 90 tablet 3  . FINGERSTIX LANCETS MISC 1 Container by Does not apply route 2 (two) times daily. 200 each 2  . glucose blood (ACCU-CHEK GUIDE) test strip 1 each by Other route 4 (four) times daily. And lancets 4/day 100 each 12   No current facility-administered medications on file prior to visit.     Allergies  Allergen Reactions  . Fish Allergy Anaphylaxis  . Shellfish Allergy Anaphylaxis    Family History  Problem Relation Age of Onset  . Lung cancer Father   . Diabetes Maternal Grandmother     BP (!) 145/98 (BP Location: Left Arm, Cuff Size: Normal)   Pulse 84   Ht 5\' 8"  (  1.727 m)   Wt 222 lb 9.6 oz (101 kg)   SpO2 94%   BMI 33.85 kg/m    Review of Systems He denies hypoglycemia    Objective:   Physical Exam VITAL SIGNS:  See vs page GENERAL: no distress Pulses: dorsalis pedis intact bilat.   MSK: no deformity of the feet CV: no leg edema Skin:  no ulcer on the feet.  normal color and temp on the feet. Neuro: sensation is intact to touch on the feet  Lab Results  Component Value Date   HGBA1C 7.5 (A) 04/04/2018       Assessment & Plan:  Type 1 DM: this is the best control this pt should aim for, given this regimen, which does match insulin to his changing needs throughout the day.   Patient Instructions  Your blood pressure is high today.  Please see your primary care provider soon, to have it rechecked check your blood sugar 4 times a day: before the 3 meals, and at bedtime.  also check if you have symptoms of your blood sugar being too high or too low.  please keep a  record of the readings and bring it to your next appointment here (or you can bring the meter itself).  You can write it on any piece of paper.  please call us sooner if your blood sugar goes below 70, or if you have a lot of readings over 200.   Please continue the same insulin.   Please come back for a follow-up appointment in 4 months.

## 2018-04-04 NOTE — Patient Instructions (Addendum)
Your blood pressure is high today.  Please see your primary care provider soon, to have it rechecked check your blood sugar 4 times a day: before the 3 meals, and at bedtime.  also check if you have symptoms of your blood sugar being too high or too low.  please keep a record of the readings and bring it to your next appointment here (or you can bring the meter itself).  You can write it on any piece of paper.  please call us sooner if your blood sugar goes below 70, or if you have a lot of readings over 200.   Please continue the same insulin.   Please come back for a follow-up appointment in 4 months.   

## 2018-08-19 ENCOUNTER — Encounter: Payer: Self-pay | Admitting: Endocrinology

## 2018-08-22 ENCOUNTER — Other Ambulatory Visit: Payer: Self-pay

## 2018-08-22 ENCOUNTER — Ambulatory Visit (INDEPENDENT_AMBULATORY_CARE_PROVIDER_SITE_OTHER): Payer: BLUE CROSS/BLUE SHIELD | Admitting: Endocrinology

## 2018-08-22 DIAGNOSIS — IMO0002 Reserved for concepts with insufficient information to code with codable children: Secondary | ICD-10-CM

## 2018-08-22 DIAGNOSIS — E108 Type 1 diabetes mellitus with unspecified complications: Secondary | ICD-10-CM | POA: Diagnosis not present

## 2018-08-22 DIAGNOSIS — E1065 Type 1 diabetes mellitus with hyperglycemia: Secondary | ICD-10-CM | POA: Diagnosis not present

## 2018-08-22 NOTE — Progress Notes (Signed)
Subjective:    Patient ID: Justin Wilkinson, male    DOB: 1981/01/03, 38 y.o.   MRN: [id number]  HPI  Pt is here for telehealth visit, via doxy video visit.  Alternatives to telehealth are presented to this patient, and the patient agrees to the visit. She is advised of the cost of the visit, and she agrees to this, also.   Patient is at home, and I am at the office.   Pt returns for f/u of diabetes mellitus:  DM type: 1 Dx'ed: 2014 Complications: none. Therapy: insulin since dx. DKA: only at dx. Severe hypoglycemia: never.  Pancreatitis: never.  Other: he declines multiple daily injections.   Interval history:  Pt says he never misses the insulin.  He says cbg's vary from 80 up to the low-100's.  There is no trend throughout the day.  pt states he feels well in general.   Past Medical History:  Diagnosis Date  . Diabetes mellitus without complication (HCC)   . Hyperlipidemia   . Hypertension     No past surgical history on file.  Social History   Socioeconomic History  . Marital status: Single    Spouse name: Not on file  . Number of children: Not on file  . Years of education: Not on file  . Highest education level: Not on file  Occupational History  . Not on file  Social Needs  . Financial resource strain: Not on file  . Food insecurity:    Worry: Not on file    Inability: Not on file  . Transportation needs:    Medical: Not on file    Non-medical: Not on file  Tobacco Use  . Smoking status: Former Smoker    Packs/day: 0.50    Types: Cigarettes    Last attempt to quit: 12/15/2012    Years since quitting: 5.6  . Smokeless tobacco: Never Used  Substance and Sexual Activity  . Alcohol use: No  . Drug use: No  . Sexual activity: Never  Lifestyle  . Physical activity:    Days per week: Not on file    Minutes per session: Not on file  . Stress: Not on file  Relationships  . Social connections:    Talks on phone: Not on file    Gets together: Not on file    Attends religious service: Not on file    Active member of club or organization: Not on file    Attends meetings of clubs or organizations: Not on file    Relationship status: Not on file  . Intimate partner violence:    Fear of current or ex partner: Not on file    Emotionally abused: Not on file    Physically abused: Not on file    Forced sexual activity: Not on file  Other Topics Concern  . Not on file  Social History Narrative  . Not on file    Current Outpatient Medications on File Prior to Visit  Medication Sig Dispense Refill  . Alcohol Swabs PADS 1 Container by Does not apply route 2 (two) times daily. 100 each 12  . atorvastatin (LIPITOR) 40 MG tablet Take 1 tablet (40 mg total) by mouth daily. 90 tablet 3  . FINGERSTIX LANCETS MISC 1 Container by Does not apply route 2 (two) times daily. 200 each 2  . glucose blood (ACCU-CHEK GUIDE) test strip 1 each by Other route 4 (four) times daily. And lancets 4/day 100 each 12  . Insulin  Glargine (LANTUS SOLOSTAR) 100 UNIT/ML Solostar Pen Inject 45 Units into the skin every morning. 15 mL 11   No current facility-administered medications on file prior to visit.     Allergies  Allergen Reactions  . Fish Allergy Anaphylaxis  . Shellfish Allergy Anaphylaxis    Family History  Problem Relation Age of Onset  . Lung cancer Father   . Diabetes Maternal Grandmother     Review of Systems He denies hypoglycemia    Objective:   Physical Exam      Assessment & Plan:  Type 1 DM: this is the best control this pt should aim for, given this regimen, which does match insulin to his changing needs throughout the day.   Please continue the same insulin.   Please come back for a follow-up appointment in 4 months.

## 2018-08-22 NOTE — Patient Instructions (Signed)
Your blood pressure is high today.  Please see your primary care provider soon, to have it rechecked check your blood sugar 4 times a day: before the 3 meals, and at bedtime.  also check if you have symptoms of your blood sugar being too high or too low.  please keep a record of the readings and bring it to your next appointment here (or you can bring the meter itself).  You can write it on any piece of paper.  please call us sooner if your blood sugar goes below 70, or if you have a lot of readings over 200.   Please continue the same insulin.   Please come back for a follow-up appointment in 4 months.

## 2018-08-24 ENCOUNTER — Other Ambulatory Visit: Payer: Self-pay

## 2018-08-24 ENCOUNTER — Ambulatory Visit (INDEPENDENT_AMBULATORY_CARE_PROVIDER_SITE_OTHER): Payer: BLUE CROSS/BLUE SHIELD

## 2018-08-24 DIAGNOSIS — E108 Type 1 diabetes mellitus with unspecified complications: Secondary | ICD-10-CM

## 2018-08-24 DIAGNOSIS — E1065 Type 1 diabetes mellitus with hyperglycemia: Secondary | ICD-10-CM

## 2018-08-24 DIAGNOSIS — IMO0002 Reserved for concepts with insufficient information to code with codable children: Secondary | ICD-10-CM

## 2018-08-24 LAB — POCT GLYCOSYLATED HEMOGLOBIN (HGB A1C): Hemoglobin A1C: 13.9 % — AB (ref 4.0–5.6)

## 2018-08-24 NOTE — Progress Notes (Signed)
Patient here for A1c. 

## 2018-12-15 ENCOUNTER — Other Ambulatory Visit: Payer: Self-pay

## 2018-12-19 ENCOUNTER — Ambulatory Visit: Payer: BLUE CROSS/BLUE SHIELD | Admitting: Endocrinology

## 2018-12-28 ENCOUNTER — Other Ambulatory Visit: Payer: Self-pay

## 2019-01-02 ENCOUNTER — Other Ambulatory Visit: Payer: Self-pay

## 2019-01-02 ENCOUNTER — Ambulatory Visit (INDEPENDENT_AMBULATORY_CARE_PROVIDER_SITE_OTHER): Payer: BLUE CROSS/BLUE SHIELD | Admitting: Endocrinology

## 2019-01-02 ENCOUNTER — Encounter: Payer: Self-pay | Admitting: Endocrinology

## 2019-01-02 VITALS — BP 128/88 | HR 84 | Ht 68.0 in | Wt 196.2 lb

## 2019-01-02 DIAGNOSIS — E1065 Type 1 diabetes mellitus with hyperglycemia: Secondary | ICD-10-CM | POA: Diagnosis not present

## 2019-01-02 DIAGNOSIS — IMO0002 Reserved for concepts with insufficient information to code with codable children: Secondary | ICD-10-CM

## 2019-01-02 DIAGNOSIS — E108 Type 1 diabetes mellitus with unspecified complications: Secondary | ICD-10-CM | POA: Diagnosis not present

## 2019-01-02 LAB — POCT GLYCOSYLATED HEMOGLOBIN (HGB A1C): Hemoglobin A1C: 13.9 % — AB (ref 4.0–5.6)

## 2019-01-02 NOTE — Patient Instructions (Addendum)
A different type of diabetes blood test is requested for you today.  We'll let you know about the results.  If it is high, we'll change to a different type of insulin that you also check once per day.  check your blood sugar 4 times a day: before the 3 meals, and at bedtime.  also check if you have symptoms of your blood sugar being too high or too low.  please keep a record of the readings and bring it to your next appointment here (or you can bring the meter itself).  You can write it on any piece of paper.  please call us sooner if your blood sugar goes below 70, or if you have a lot of readings over 200.   Please continue the same insulin.   Please come back for a follow-up appointment in 3 months.

## 2019-01-02 NOTE — Progress Notes (Signed)
Subjective:    Patient ID: Justin Wilkinson, male    DOB: 20-May-1980, 38 y.o.   MRN: [id number]  HPI Pt returns for f/u of diabetes mellitus:  DM type: 1 Dx'ed: 9379 Complications: none. Therapy: insulin since dx. DKA: only at dx. Severe hypoglycemia: never.  Pancreatitis: never.  Other: he declines multiple daily injections; high A1c has been confirmed with fructosamine.    Interval history:  Pt says he never misses the insulin.  He says cbg's vary from 70 up to the low-100's.  There is no trend throughout the day.  pt states he feels well in general.  He says his meter is only 34 mos old.   Past Medical History:  Diagnosis Date  . Diabetes mellitus without complication ([place])   . Hyperlipidemia   . Hypertension     No past surgical history on file.  Social History   Socioeconomic History  . Marital status: Single    Spouse name: Not on file  . Number of children: Not on file  . Years of education: Not on file  . Highest education level: Not on file  Occupational History  . Not on file  Social Needs  . Financial resource strain: Not on file  . Food insecurity    Worry: Not on file    Inability: Not on file  . Transportation needs    Medical: Not on file    Non-medical: Not on file  Tobacco Use  . Smoking status: Former Smoker    Packs/day: 0.50    Types: Cigarettes    Quit date: 12/15/2012    Years since quitting: 6.0  . Smokeless tobacco: Never Used  Substance and Sexual Activity  . Alcohol use: No  . Drug use: No  . Sexual activity: Never  Lifestyle  . Physical activity    Days per week: Not on file    Minutes per session: Not on file  . Stress: Not on file  Relationships  . Social Herbalist on phone: Not on file    Gets together: Not on file    Attends religious service: Not on file    Active member of club or organization: Not on file    Attends meetings of clubs or organizations: Not on file    Relationship status: Not on file  .  Intimate partner violence    Fear of current or ex partner: Not on file    Emotionally abused: Not on file    Physically abused: Not on file    Forced sexual activity: Not on file  Other Topics Concern  . Not on file  Social History Narrative  . Not on file    Current Outpatient Medications on File Prior to Visit  Medication Sig Dispense Refill  . Alcohol Swabs PADS 1 Container by Does not apply route 2 (two) times daily. 100 each 12  . atorvastatin (LIPITOR) 40 MG tablet Take 1 tablet (40 mg total) by mouth daily. 90 tablet 3  . FINGERSTIX LANCETS MISC 1 Container by Does not apply route 2 (two) times daily. 200 each 2  . glucose blood (ACCU-CHEK GUIDE) test strip 1 each by Other route 4 (four) times daily. And lancets 4/day 100 each 12  . Insulin Glargine (LANTUS SOLOSTAR) 100 UNIT/ML Solostar Pen Inject 45 Units into the skin every morning. 15 mL 11   No current facility-administered medications on file prior to visit.     Allergies  Allergen Reactions  .  Fish Allergy Anaphylaxis  . Shellfish Allergy Anaphylaxis    Family History  Problem Relation Age of Onset  . Lung cancer Father   . Diabetes Maternal Grandmother     BP 128/88 (BP Location: Left Arm, Patient Position: Sitting, Cuff Size: Normal)   Pulse 84   Ht 5\' 8"  (1.727 m)   Wt 196 lb 3.2 oz (89 kg)   SpO2 98%   BMI 29.83 kg/m    Review of Systems Denies LOC.     Objective:   Physical Exam VITAL SIGNS:  See vs page GENERAL: no distress Pulses: dorsalis pedis intact bilat.   MSK: no deformity of the feet CV: no leg edema Skin:  no ulcer on the feet.  normal color and temp on the feet. Neuro: sensation is intact to touch on the feet  Lab Results  Component Value Date   HGBA1C 13.9 (A) 01/02/2019       Assessment & Plan:  Type 1 DM: very poor glycemic control. Discordance between a1c and reported cbg's: check fructosamine.  Patient Instructions  A different type of diabetes blood test is  requested for you today.  We'll let you know about the results.  If it is high, we'll change to a different type of insulin that you also check once per day.  check your blood sugar 4 times a day: before the 3 meals, and at bedtime.  also check if you have symptoms of your blood sugar being too high or too low.  please keep a record of the readings and bring it to your next appointment here (or you can bring the meter itself).  You can write it on any piece of paper.  please call us sooner if your blood sugar goes below 70, or if you have a lot of readings over 200.   Please continue the same insulin.   Please come back for a follow-up appointment in 3 months.

## 2019-01-06 ENCOUNTER — Other Ambulatory Visit: Payer: Self-pay | Admitting: Endocrinology

## 2019-01-06 LAB — FRUCTOSAMINE: Fructosamine: 512 umol/L — ABNORMAL HIGH (ref 205–285)

## 2019-01-06 MED ORDER — TRESIBA FLEXTOUCH 100 UNIT/ML ~~LOC~~ SOPN: 45.0000 [IU] | PEN_INJECTOR | Freq: Every day | SUBCUTANEOUS | 11 refills | Status: DC

## 2019-04-03 ENCOUNTER — Ambulatory Visit: Payer: BLUE CROSS/BLUE SHIELD | Admitting: Endocrinology

## 2019-07-18 DIAGNOSIS — R030 Elevated blood-pressure reading, without diagnosis of hypertension: Secondary | ICD-10-CM | POA: Diagnosis not present

## 2019-07-18 DIAGNOSIS — B349 Viral infection, unspecified: Secondary | ICD-10-CM | POA: Diagnosis not present

## 2019-07-27 ENCOUNTER — Encounter (HOSPITAL_COMMUNITY): Payer: Self-pay | Admitting: Emergency Medicine

## 2019-07-27 ENCOUNTER — Observation Stay (HOSPITAL_COMMUNITY)
Admission: EM | Admit: 2019-07-27 | Discharge: 2019-07-28 | Disposition: A | Discharge status: Home / Self Care | Payer: BC Managed Care – PPO | Attending: Internal Medicine | Admitting: Internal Medicine

## 2019-07-27 ENCOUNTER — Other Ambulatory Visit: Payer: Self-pay

## 2019-07-27 DIAGNOSIS — Z87891 Personal history of nicotine dependence: Secondary | ICD-10-CM | POA: Diagnosis not present

## 2019-07-27 DIAGNOSIS — N289 Disorder of kidney and ureter, unspecified: Secondary | ICD-10-CM | POA: Diagnosis not present

## 2019-07-27 DIAGNOSIS — N179 Acute kidney failure, unspecified: Secondary | ICD-10-CM | POA: Insufficient documentation

## 2019-07-27 DIAGNOSIS — I10 Essential (primary) hypertension: Secondary | ICD-10-CM | POA: Insufficient documentation

## 2019-07-27 DIAGNOSIS — R12 Heartburn: Secondary | ICD-10-CM | POA: Insufficient documentation

## 2019-07-27 DIAGNOSIS — Z03818 Encounter for observation for suspected exposure to other biological agents ruled out: Secondary | ICD-10-CM | POA: Diagnosis not present

## 2019-07-27 DIAGNOSIS — E1010 Type 1 diabetes mellitus with ketoacidosis without coma: Principal | ICD-10-CM | POA: Diagnosis present

## 2019-07-27 DIAGNOSIS — E785 Hyperlipidemia, unspecified: Secondary | ICD-10-CM | POA: Diagnosis not present

## 2019-07-27 DIAGNOSIS — E139 Other specified diabetes mellitus without complications: Secondary | ICD-10-CM

## 2019-07-27 DIAGNOSIS — E876 Hypokalemia: Secondary | ICD-10-CM | POA: Diagnosis not present

## 2019-07-27 DIAGNOSIS — Z79899 Other long term (current) drug therapy: Secondary | ICD-10-CM | POA: Insufficient documentation

## 2019-07-27 DIAGNOSIS — R030 Elevated blood-pressure reading, without diagnosis of hypertension: Secondary | ICD-10-CM

## 2019-07-27 DIAGNOSIS — Z833 Family history of diabetes mellitus: Secondary | ICD-10-CM | POA: Diagnosis not present

## 2019-07-27 DIAGNOSIS — Z20822 Contact with and (suspected) exposure to covid-19: Secondary | ICD-10-CM | POA: Diagnosis not present

## 2019-07-27 LAB — BASIC METABOLIC PANEL
Anion gap: 11 (ref 5–15)
Anion gap: 10 (ref 5–15)
BUN: 9 mg/dL (ref 6–20)
BUN: 9 mg/dL (ref 6–20)
CO2: 19 mmol/L — ABNORMAL LOW (ref 22–32)
CO2: 19 mmol/L — ABNORMAL LOW (ref 22–32)
Calcium: 8.8 mg/dL — ABNORMAL LOW (ref 8.9–10.3)
Calcium: 8.6 mg/dL — ABNORMAL LOW (ref 8.9–10.3)
Chloride: 109 mmol/L (ref 98–111)
Chloride: 106 mmol/L (ref 98–111)
Creatinine, Ser: 1.22 mg/dL (ref 0.61–1.24)
Creatinine, Ser: 1.06 mg/dL (ref 0.61–1.24)
GFR calc Af Amer: >60 mL/min (ref >60)
GFR calc Af Amer: >60 mL/min (ref >60)
GFR calc non Af Amer: >60 mL/min (ref >60)
GFR calc non Af Amer: >60 mL/min (ref >60)
Glucose, Bld: 185 mg/dL — ABNORMAL HIGH (ref 70–99)
Glucose, Bld: 247 mg/dL — ABNORMAL HIGH (ref 70–99)
Potassium: 3.4 mmol/L — ABNORMAL LOW (ref 3.5–5.1)
Potassium: 3.3 mmol/L — ABNORMAL LOW (ref 3.5–5.1)
Sodium: 139 mmol/L (ref 135–145)
Sodium: 135 mmol/L (ref 135–145)

## 2019-07-27 LAB — POCT I-STAT EG7
Acid-base deficit: 11 mmol/L — ABNORMAL HIGH (ref 0.0–2.0)
Bicarbonate: 14.4 mmol/L — ABNORMAL LOW (ref 20.0–28.0)
Calcium, Ion: 1.27 mmol/L (ref 1.15–1.40)
HCT: 46 % (ref 39.0–52.0)
Hemoglobin: 15.6 g/dL (ref 13.0–17.0)
O2 Saturation: 91 %
Potassium: 3.9 mmol/L (ref 3.5–5.1)
Sodium: 130 mmol/L — ABNORMAL LOW (ref 135–145)
TCO2: 15 mmol/L — ABNORMAL LOW (ref 22–32)
pCO2, Ven: 30.4 mmHg — ABNORMAL LOW (ref 44.0–60.0)
pH, Ven: 7.283 (ref 7.250–7.430)
pO2, Ven: 68 mmHg — ABNORMAL HIGH (ref 32.0–45.0)

## 2019-07-27 LAB — CBC
HCT: 47.1 % (ref 39.0–52.0)
Hemoglobin: 16.4 g/dL (ref 13.0–17.0)
MCH: 30.8 pg (ref 26.0–34.0)
MCHC: 34.8 g/dL (ref 30.0–36.0)
MCV: 88.5 fL (ref 80.0–100.0)
Platelets: 292 10*3/uL (ref 150–400)
RBC: 5.32 MIL/uL (ref 4.22–5.81)
RDW: 13.7 % (ref 11.5–15.5)
WBC: 7 10*3/uL (ref 4.0–10.5)
nRBC: 0 % (ref 0.0–0.2)

## 2019-07-27 LAB — COMPREHENSIVE METABOLIC PANEL
ALT: 18 U/L (ref 0–44)
AST: 12 U/L — ABNORMAL LOW (ref 15–41)
Albumin: 3.8 g/dL (ref 3.5–5.0)
Alkaline Phosphatase: 62 U/L (ref 38–126)
Anion gap: 20 — ABNORMAL HIGH (ref 5–15)
BUN: 15 mg/dL (ref 6–20)
CO2: 15 mmol/L — ABNORMAL LOW (ref 22–32)
Calcium: 9.9 mg/dL (ref 8.9–10.3)
Chloride: 100 mmol/L (ref 98–111)
Creatinine, Ser: 1.31 mg/dL — ABNORMAL HIGH (ref 0.61–1.24)
GFR calc Af Amer: >60 mL/min (ref >60)
GFR calc non Af Amer: >60 mL/min (ref >60)
Glucose, Bld: 309 mg/dL — ABNORMAL HIGH (ref 70–99)
Potassium: 4.2 mmol/L (ref 3.5–5.1)
Sodium: 135 mmol/L (ref 135–145)
Total Bilirubin: 1.7 mg/dL — ABNORMAL HIGH (ref 0.3–1.2)
Total Protein: 8.3 g/dL — ABNORMAL HIGH (ref 6.5–8.1)

## 2019-07-27 LAB — SARS CORONAVIRUS 2 (TAT 6-24 HRS): SARS Coronavirus 2: NEGATIVE

## 2019-07-27 LAB — URINALYSIS, ROUTINE W REFLEX MICROSCOPIC
Bacteria, UA: NONE SEEN
Bilirubin Urine: NEGATIVE
Glucose, UA: >=500 mg/dL — AB
Hgb urine dipstick: NEGATIVE
Ketones, ur: 20 mg/dL — AB
Leukocytes,Ua: NEGATIVE
Nitrite: NEGATIVE
Protein, ur: NEGATIVE mg/dL
Specific Gravity, Urine: 1.028 (ref 1.005–1.030)
pH: 6 (ref 5.0–8.0)

## 2019-07-27 LAB — MRSA PCR SCREENING: MRSA by PCR: NEGATIVE

## 2019-07-27 LAB — BETA-HYDROXYBUTYRIC ACID
Beta-Hydroxybutyric Acid: 7.54 mmol/L — ABNORMAL HIGH (ref 0.05–0.27)
Beta-Hydroxybutyric Acid: 2.34 mmol/L — ABNORMAL HIGH (ref 0.05–0.27)

## 2019-07-27 LAB — CBG MONITORING, ED
Glucose-Capillary: 279 mg/dL — ABNORMAL HIGH (ref 70–99)
Glucose-Capillary: >600 mg/dL (ref 70–99)
Glucose-Capillary: 490 mg/dL — ABNORMAL HIGH (ref 70–99)
Glucose-Capillary: 303 mg/dL — ABNORMAL HIGH (ref 70–99)
Glucose-Capillary: 325 mg/dL — ABNORMAL HIGH (ref 70–99)
Glucose-Capillary: 446 mg/dL — ABNORMAL HIGH (ref 70–99)

## 2019-07-27 LAB — MAGNESIUM: Magnesium: 1.9 mg/dL (ref 1.7–2.4)

## 2019-07-27 LAB — HIV ANTIBODY (ROUTINE TESTING W REFLEX): HIV Screen 4th Generation wRfx: NONREACTIVE

## 2019-07-27 MED ORDER — ALUM & MAG HYDROXIDE-SIMETH 200-200-20 MG/5ML PO SUSP
30.0000 mL | Freq: Once | ORAL | Status: AC
  Administered 2019-07-27: 30 mL via ORAL
  Filled 2019-07-27: qty 30

## 2019-07-27 MED ORDER — SODIUM CHLORIDE 0.9 % IV SOLN: INTRAVENOUS | Status: DC

## 2019-07-27 MED ORDER — INSULIN REGULAR(HUMAN) IN NACL 100-0.9 UT/100ML-% IV SOLN
INTRAVENOUS | Status: DC
  Administered 2019-07-27: 14:00:00 13 [IU]/h via INTRAVENOUS
  Filled 2019-07-27: qty 100

## 2019-07-27 MED ORDER — POTASSIUM CHLORIDE 10 MEQ/100ML IV SOLN
10.0000 meq | INTRAVENOUS | Status: AC
  Administered 2019-07-27 (×2): 10 meq via INTRAVENOUS
  Filled 2019-07-27 (×2): qty 100

## 2019-07-27 MED ORDER — HYDRALAZINE HCL 25 MG PO TABS: 25.0000 mg | ORAL_TABLET | Freq: Four times a day (QID) | ORAL | Status: DC | PRN

## 2019-07-27 MED ORDER — ATORVASTATIN CALCIUM 40 MG PO TABS
40.0000 mg | ORAL_TABLET | Freq: Every day | ORAL | Status: DC
  Administered 2019-07-27 – 2019-07-28 (×2): 40 mg via ORAL
  Filled 2019-07-27 (×2): qty 1

## 2019-07-27 MED ORDER — DEXTROSE-NACL 5-0.45 % IV SOLN: INTRAVENOUS | Status: DC

## 2019-07-27 MED ORDER — DEXTROSE 50 % IV SOLN: 0.0000 mL | INTRAVENOUS | Status: DC | PRN

## 2019-07-27 MED ORDER — SODIUM CHLORIDE 0.9 % IV BOLUS
1000.0000 mL | Freq: Once | INTRAVENOUS | Status: AC
  Administered 2019-07-27: 15:00:00 1000 mL via INTRAVENOUS

## 2019-07-27 MED ORDER — ENOXAPARIN SODIUM 40 MG/0.4ML ~~LOC~~ SOLN: 40.0000 mg | SUBCUTANEOUS | Status: DC

## 2019-07-27 MED ORDER — SODIUM CHLORIDE 0.9 % IV BOLUS
1000.0000 mL | Freq: Once | INTRAVENOUS | Status: AC
  Administered 2019-07-27: 13:00:00 1000 mL via INTRAVENOUS

## 2019-07-27 NOTE — ED Notes (Signed)
CBG Results of HI >600 reported to Phill, RN. 

## 2019-07-27 NOTE — H&P (Addendum)
History and Physical    Justin Wilkinson DOB: 1981-01-18 DOA: 07/27/2019  Referring MD/NP/PA: Evelena Leyden, PA-C PCP: Myrlene Broker, MD  Patient coming from: home  Chief Complaint: Fatigue  I have personally briefly reviewed patient's old medical records in [facility]   HPI: Justin Wilkinson is a 39 y.o. male with medical history significant of DM type 1, hypertension, and hyperlipidemia with complaints of at least a month long history of fatigue.  He had missed a follow-up appointment with his primary care provider in December 2020, and has been without his insulin since that time.  He had tried managing his diabetes with diet.  However since that time he has been having polydipsia, urinary frequency, weight loss of approximately 20 pounds, and complaints of heartburn.  Denies having any nausea, vomiting, fever, shortness of breath, chest pain, diarrhea, or dysuria symptoms.  On the only other associated symptom that he notices that he has been having these body aches.  He was finally able to schedule follow-up appointment with his primary care provider that is scheduled for April 12.  Patient had not been on blood pressure medication previously, but he was seen at urgent care couple weeks ago due to his symptoms he was noted to have elevated blood pressures.  Last admitted for DKA back in 2014 where he was in the ICU.  ED Course: Upon admission into the emergency department patient was noted to be afebrile, pulse 93-106, blood pressures 137/103-139/98, and all other vital signs maintained.  Labs significant for CO2 15, creatinine 1.31, glucose 309, anion gap 20, and total bilirubin 1.7.  Venous pH was 7.283 and urinalysis was positive for glucose and ketones.  On the ED patient had repeat blood sugars greater than 600.  Patient was bolused 1 L normal saline IV fluids, given 20 mEq of potassium chloride, and started on insulin drip per protocol.  Review of Systems    Constitutional: Positive for malaise/fatigue and weight loss. Negative for fever.  HENT: Negative for congestion and ear discharge.   Eyes: Negative for photophobia and pain.  Respiratory: Negative for cough and shortness of breath.   Cardiovascular: Negative for chest pain and leg swelling.  Gastrointestinal: Positive for heartburn. Negative for abdominal pain, diarrhea, nausea and vomiting.  Genitourinary: Positive for frequency.  Musculoskeletal: Positive for myalgias. Negative for falls.  Skin: Negative for itching and rash.  Neurological: Positive for weakness. Negative for focal weakness and loss of consciousness.  Endo/Heme/Allergies: Positive for polydipsia. Does not bruise/bleed easily.  Psychiatric/Behavioral: Negative for substance abuse.    Past Medical History:  Diagnosis Date  . Diabetes mellitus without complication (HCC)   . Hyperlipidemia   . Hypertension     History reviewed. No pertinent surgical history.   reports that he quit smoking about 6 years ago. His smoking use included cigarettes. He smoked 0.50 packs per day. He has never used smokeless tobacco. He reports that he does not drink alcohol or use drugs.  Allergies  Allergen Reactions  . Fish Allergy Anaphylaxis  . Shellfish Allergy Anaphylaxis    Family History  Problem Relation Age of Onset  . Lung cancer Father   . Diabetes Maternal Grandmother     Prior to Admission medications   Medication Sig Start Date End Date Taking? Authorizing Provider  Alcohol Swabs PADS 1 Container by Does not apply route 2 (two) times daily. 01/03/13   Alison Murray, MD  atorvastatin (LIPITOR) 40 MG tablet Take 1 tablet (  40 mg total) by mouth daily. 07/27/16   Myrlene Broker, MD  FINGERSTIX LANCETS MISC 1 Container by Does not apply route 2 (two) times daily. 05/29/14   Doris Cheadle, MD  glucose blood (ACCU-CHEK GUIDE) test strip 1 each by Other route 4 (four) times daily. And lancets 4/day 12/20/17   Romero Belling, MD  insulin degludec (TRESIBA FLEXTOUCH) 100 UNIT/ML SOPN FlexTouch Pen Inject 0.45 mLs (45 Units total) into the skin daily. And pen needles 1/day 01/06/19   Romero Belling, MD    Physical Exam:  Constitutional: Middle-age male who appears to be no acute distress Vitals:   07/27/19 0710 07/27/19 0721 07/27/19 1244  BP: (!) 137/103  (!) 139/98  Pulse: (!) 106  93  Resp: 16  18  Temp: 98 F (36.7 C)  97.7 F (36.5 C)  TempSrc: Oral  Oral  SpO2: 100%  100%  Weight: 79.8 kg 77.1 kg   Height: 5\' 9"  (1.753 m) 5\' 9"  (1.753 m)    Eyes: PERRL, lids and conjunctivae normal ENMT: Mucous membranes are dry.  Posterior pharynx clear of any exudate or lesions.  Neck: normal, supple, no masses, no thyromegaly Respiratory: clear to auscultation bilaterally, no wheezing, no crackles. Normal respiratory effort. No accessory muscle use.  Cardiovascular: Regular rate and rhythm, no murmurs / rubs / gallops. No extremity edema. 2+ pedal pulses. No carotid bruits.  Abdomen: no tenderness, no masses palpated. No hepatosplenomegaly. Bowel sounds positive.  Musculoskeletal: no clubbing / cyanosis. No joint deformity upper and lower extremities. Good ROM, no contractures. Normal muscle tone.  Skin: no rashes, lesions, ulcers. No induration Neurologic: CN 2-12 grossly intact. Sensation intact, DTR normal. Strength 5/5 in all 4.  Psychiatric: Normal judgment and insight. Alert and oriented x 3. Normal mood.     Labs on Admission: I have personally reviewed following labs and imaging studies  CBC: Recent Labs  Lab 07/27/19 0726 07/27/19 1306  WBC 7.0  --   HGB 16.4 15.6  HCT 47.1 46.0  MCV 88.5  --   PLT 292  --    Basic Metabolic Panel: Recent Labs  Lab 07/27/19 0726 07/27/19 1306  NA 135 130*  K 4.2 3.9  CL 100  --   CO2 15*  --   GLUCOSE 309*  --   BUN 15  --   CREATININE 1.31*  --   CALCIUM 9.9  --    GFR: Estimated Creatinine Clearance: 75.7 mL/min (A) (by C-G formula based on  SCr of 1.31 mg/dL (H)). Liver Function Tests: Recent Labs  Lab 07/27/19 0726  AST 12*  ALT 18  ALKPHOS 62  BILITOT 1.7*  PROT 8.3*  ALBUMIN 3.8   No results for input(s): LIPASE, AMYLASE in the last 168 hours. No results for input(s): AMMONIA in the last 168 hours. Coagulation Profile: No results for input(s): INR, PROTIME in the last 168 hours. Cardiac Enzymes: No results for input(s): CKTOTAL, CKMB, CKMBINDEX, TROPONINI in the last 168 hours. BNP (last 3 results) No results for input(s): PROBNP in the last 8760 hours. HbA1C: No results for input(s): HGBA1C in the last 72 hours. CBG: Recent Labs  Lab 07/27/19 0722 07/27/19 1247 07/27/19 1355  GLUCAP 279* >600* 490*   Lipid Profile: No results for input(s): CHOL, HDL, LDLCALC, TRIG, CHOLHDL, LDLDIRECT in the last 72 hours. Thyroid Function Tests: No results for input(s): TSH, T4TOTAL, FREET4, T3FREE, THYROIDAB in the last 72 hours. Anemia Panel: No results for input(s): VITAMINB12, FOLATE, FERRITIN, TIBC,  IRON, RETICCTPCT in the last 72 hours. Urine analysis:    Component Value Date/Time   COLORURINE STRAW (A) 07/27/2019 1304   APPEARANCEUR CLEAR 07/27/2019 1304   LABSPEC 1.028 07/27/2019 1304   PHURINE 6.0 07/27/2019 1304   GLUCOSEU >=500 (A) 07/27/2019 1304   HGBUR NEGATIVE 07/27/2019 1304   BILIRUBINUR NEGATIVE 07/27/2019 1304   KETONESUR 20 (A) 07/27/2019 1304   PROTEINUR NEGATIVE 07/27/2019 1304   UROBILINOGEN 0.2 12/15/2012 1145   NITRITE NEGATIVE 07/27/2019 1304   LEUKOCYTESUR NEGATIVE 07/27/2019 1304   Sepsis Labs: No results found for this or any previous visit (from the past 240 hour(s)).   Radiological Exams on Admission: No results found.    Assessment/Plan DKA, type I: Acute.  Patient reportedly had been without insulin for couple months now due to missed appointment.  Reports having fatigue, weight loss, urinary frequency, and polydipsia.  Initial glucoses elevated up to 600s with CO2 15, and  anion gap initially 20. Urinalysis was positive for glucose and ketones. -Admit to a progressive bed -DKA order set utilized -Check hemoglobin A1c -Insulin drip per protocol -Give additional liter of normal saline IV fluids, and then placed on a rate of 125 mL/h -Transition to subcu insulin when medically appropriate -Advised patient to keep follow-up appointment with his primary care provider on April 12  Renal insufficiency: Acute.  Creatinine 1.3 admission. -Continue to monitor   Elevated blood pressure: On admission patient's diastolic blood pressure elevated into the 90s.  He is not on any blood pressure medications at this time. -Hydralazine as needed  Hyperlipidemia: Previous medications include atorvastatin 40 mg daily. -Restart atorvastatin 40 mg daily -Patient we will need prescription at discharge  DVT prophylaxis: Lovenox Code Status: Full Family Communication: Plan of care discussed with the patient and his girlfriend present at bedside Disposition Plan: [facility] discharge home in a.m. if medically stable Consults called: None  Admission status: Observation  Norval Morton MD [facility] Pager [phone]   If 7PM-7AM, please contact night-coverage [email or web address] Password [facility]  07/27/2019, 1:59 PM

## 2019-07-27 NOTE — Plan of Care (Signed)

## 2019-07-27 NOTE — ED Provider Notes (Signed)
[facility] EMERGENCY DEPARTMENT Provider Note   CSN: [id number] Arrival date & time: 07/27/19  9234     History Chief Complaint  Patient presents with  . Hyperglycemia  . out of insulin    Justin Wilkinson is a 39 y.o. male with PMH significant for type 1 diabetes, HTN, and HLD presents to the ED with complaints of progressively worsening fatigue, weakness, polyuria, and polydipsia.  He states that he missed a follow-up appointment with his primary care provider at the end of 2020 and has since been without insulin.  He does not check his CBG regularly at home.  He is accompanied by his girlfriend who reports that he was once admitted to the hospital for DKA with sugars >1,500 and was borderline comatose.  Patient is also endorsing weight loss over the course of the past month which he attributes to his symptoms of sluggishness.  He also endorses mild epigastric burning sensation on a regular basis that he has been treating with over-the-counter antacids.  He denies any fevers or chills, recent illness, nausea or vomiting, changes in bowel habits, chest pain, shortness of breath or cough, or other symptoms.  HPI     Past Medical History:  Diagnosis Date  . Diabetes mellitus without complication (HCC)   . Hyperlipidemia   . Hypertension     Patient Active Problem List   Diagnosis Date Noted  . DKA, type 1 (HCC) 07/27/2019  . Dyslipidemia 04/03/2013  . Type I diabetes mellitus with complication, uncontrolled (HCC) 01/03/2013  . Essential hypertension, benign 12/17/2012    History reviewed. No pertinent surgical history.     Family History  Problem Relation Age of Onset  . Lung cancer Father   . Diabetes Maternal Grandmother     Social History   Tobacco Use  . Smoking status: Former Smoker    Packs/day: 0.50    Types: Cigarettes    Quit date: 12/15/2012    Years since quitting: 6.6  . Smokeless tobacco: Never Used  Substance Use Topics  . Alcohol  use: No  . Drug use: No    Home Medications Prior to Admission medications   Medication Sig Start Date End Date Taking? Authorizing Provider  Alcohol Swabs PADS 1 Container by Does not apply route 2 (two) times daily. 01/03/13   Alison Murray, MD  atorvastatin (LIPITOR) 40 MG tablet Take 1 tablet (40 mg total) by mouth daily. 07/27/16   Myrlene Broker, MD  FINGERSTIX LANCETS MISC 1 Container by Does not apply route 2 (two) times daily. 05/29/14   Doris Cheadle, MD  glucose blood (ACCU-CHEK GUIDE) test strip 1 each by Other route 4 (four) times daily. And lancets 4/day 12/20/17   Romero Belling, MD  insulin degludec (TRESIBA FLEXTOUCH) 100 UNIT/ML SOPN FlexTouch Pen Inject 0.45 mLs (45 Units total) into the skin daily. And pen needles 1/day 01/06/19   Romero Belling, MD    Allergies    Fish allergy and Shellfish allergy  Review of Systems   Review of Systems  All other systems reviewed and are negative.   Physical Exam Updated Vital Signs BP (!) 139/98 (BP Location: Right Arm)   Pulse 93   Temp 97.7 F (36.5 C) (Oral)   Resp 18   Ht 5\' 9"  (1.753 m)   Wt 77.1 kg   SpO2 100%   BMI 25.10 kg/m   Physical Exam Vitals and nursing note reviewed. Exam conducted with a chaperone present.  Constitutional:  Appearance: He is ill-appearing.  HENT:     Head: Normocephalic and atraumatic.  Eyes:     General: No scleral icterus.    Conjunctiva/sclera: Conjunctivae normal.  Cardiovascular:     Rate and Rhythm: Regular rhythm. Tachycardia present.     Pulses: Normal pulses.     Heart sounds: Normal heart sounds.  Pulmonary:     Effort: Pulmonary effort is normal. No respiratory distress.     Breath sounds: Normal breath sounds.  Abdominal:     General: Abdomen is flat. There is no distension.     Palpations: Abdomen is soft.     Tenderness: There is no abdominal tenderness. There is no guarding.  Musculoskeletal:        General: Normal range of motion.     Right lower leg:  No edema.     Left lower leg: No edema.  Skin:    General: Skin is dry.     Capillary Refill: Capillary refill takes less than 2 seconds.  Neurological:     Mental Status: He is alert and oriented to person, place, and time.     GCS: GCS eye subscore is 4. GCS verbal subscore is 5. GCS motor subscore is 6.  Psychiatric:        Mood and Affect: Mood normal.        Behavior: Behavior normal.        Thought Content: Thought content normal.     ED Results / Procedures / Treatments   Labs (all labs ordered are listed, but only abnormal results are displayed) Labs Reviewed  URINALYSIS, ROUTINE W REFLEX MICROSCOPIC - Abnormal; Notable for the following components:      Result Value   Color, Urine STRAW (*)    Glucose, UA >=500 (*)    Ketones, ur 20 (*)    All other components within normal limits  COMPREHENSIVE METABOLIC PANEL - Abnormal; Notable for the following components:   CO2 15 (*)    Glucose, Bld 309 (*)    Creatinine, Ser 1.31 (*)    Total Protein 8.3 (*)    AST 12 (*)    Total Bilirubin 1.7 (*)    Anion gap 20 (*)    All other components within normal limits  CBG MONITORING, ED - Abnormal; Notable for the following components:   Glucose-Capillary 279 (*)    All other components within normal limits  CBG MONITORING, ED - Abnormal; Notable for the following components:   Glucose-Capillary >600 (*)    All other components within normal limits  POCT I-STAT EG7 - Abnormal; Notable for the following components:   pCO2, Ven 30.4 (*)    pO2, Ven 68.0 (*)    Bicarbonate 14.4 (*)    TCO2 15 (*)    Acid-base deficit 11.0 (*)    Sodium 130 (*)    All other components within normal limits  CBG MONITORING, ED - Abnormal; Notable for the following components:   Glucose-Capillary 490 (*)    All other components within normal limits  CBC  BLOOD GAS, VENOUS  BETA-HYDROXYBUTYRIC ACID  BETA-HYDROXYBUTYRIC ACID  HIV ANTIBODY (ROUTINE TESTING W REFLEX)  BASIC METABOLIC PANEL    BASIC METABOLIC PANEL  BASIC METABOLIC PANEL  BASIC METABOLIC PANEL  HEMOGLOBIN A1C    EKG None  Radiology No results found.  Procedures .Critical Care Performed by: Corena Herter, PA-C Authorized by: Corena Herter, PA-C   Critical care provider statement:    Critical care time (minutes):  45   Critical care was necessary to treat or prevent imminent or life-threatening deterioration of the following conditions:  Metabolic crisis   Critical care was time spent personally by me on the following activities:  Discussions with consultants, evaluation of patient's response to treatment, examination of patient, ordering and performing treatments and interventions, ordering and review of laboratory studies, ordering and review of radiographic studies, pulse oximetry, re-evaluation of patient's condition, obtaining history from patient or surrogate and review of old charts   (including critical care time)  Medications Ordered in ED Medications  insulin regular, human (MYXREDLIN) 100 units/ 100 mL infusion (13 Units/hr Intravenous New Bag/Given 07/27/19 1358)  dextrose 5 %-0.45 % sodium chloride infusion (has no administration in time range)  dextrose 50 % solution 0-50 mL (has no administration in time range)  potassium chloride 10 mEq in 100 mL IVPB (10 mEq Intravenous New Bag/Given 07/27/19 1340)  enoxaparin (LOVENOX) injection 40 mg (has no administration in time range)  sodium chloride 0.9 % bolus 1,000 mL (has no administration in time range)  0.9 %  sodium chloride infusion (has no administration in time range)  sodium chloride 0.9 % bolus 1,000 mL (1,000 mLs Intravenous New Bag/Given 07/27/19 1305)    ED Course  I have reviewed the triage vital signs and the nursing notes.  Pertinent labs & imaging results that were available during my care of the patient were reviewed by me and considered in my medical decision making (see chart for details).  Clinical Course as of Jul 27 1410  Thu Jul 27, 2019  1402 Spoke with Dr. Katrinka Blazing with [facility] who will admit patient for DKA.   [GG]    Clinical Course User Index [GG] Lorelee New, PA-C   MDM Rules/Calculators/A&P                      Patient has low bicarb, elevated anion gap, ketonuria, and CBG greater than 600 which suggests type I diabetic ketoacidosis without coma, consistent with patient's history of uncontrolled hyperglycemia.  Precipitating factor being poor compliance.  No history or other evidence on physical exam to suggest infection.  Patient is being given IVF, insulin using Endo tool, as well as potassium.  On reevaluation, patient is still denying any nausea or abdominal symptoms.  Consulting [facility] for admission for DKA.  Spoke with Dr. Katrinka Blazing with [facility] who will admit patient for DKA.  Final Clinical Impression(s) / ED Diagnoses Final diagnoses:  Diabetic ketoacidosis without coma associated with type 1 diabetes mellitus [facility])    Rx / DC Orders ED Discharge Orders    None       Lorelee New, PA-C 07/27/19 1412    Charlynne Pander, MD 07/29/19 [phone]

## 2019-07-27 NOTE — ED Triage Notes (Signed)
C/o out of insulin since December-- unable to check CBG at home, states feels weak, polyuria, polydipsia

## 2019-07-27 NOTE — Progress Notes (Signed)
Inpatient Diabetes Program Recommendations  AACE/ADA: New Consensus Statement on Inpatient Glycemic Control (2015)  Target Ranges:  Prepandial:   less than 140 mg/dL      Peak postprandial:   less than 180 mg/dL (1-2 hours)      Critically ill patients:  140 - 180 mg/dL   Lab Results  Component Value Date   GLUCAP 303 (H) 07/27/2019   HGBA1C 13.9 (A) 01/02/2019    Review of Glycemic Control  Diabetes history: DM Outpatient Diabetes medications: Tresiba 45 units qd (note states out of meds since December) Current orders for Inpatient glycemic control: IV insulin drip  Inpatient Diabetes Program Recommendations:   Noted patient in DKA on admission and currently on IV insulin drip. Will followup in am and plan to speak with patient.  Thank you, Billy Fischer. Lamesha Tibbits, RN, MSN, CDE  Diabetes Coordinator Inpatient Glycemic Control Team Team Pager [phone] (8am-5pm) 07/27/2019 3:43 PM

## 2019-07-28 DIAGNOSIS — E1010 Type 1 diabetes mellitus with ketoacidosis without coma: Secondary | ICD-10-CM | POA: Diagnosis not present

## 2019-07-28 LAB — GLUCOSE, CAPILLARY
Glucose-Capillary: 114 mg/dL — ABNORMAL HIGH (ref 70–99)
Glucose-Capillary: 133 mg/dL — ABNORMAL HIGH (ref 70–99)
Glucose-Capillary: 134 mg/dL — ABNORMAL HIGH (ref 70–99)
Glucose-Capillary: 136 mg/dL — ABNORMAL HIGH (ref 70–99)
Glucose-Capillary: 145 mg/dL — ABNORMAL HIGH (ref 70–99)
Glucose-Capillary: 148 mg/dL — ABNORMAL HIGH (ref 70–99)
Glucose-Capillary: 148 mg/dL — ABNORMAL HIGH (ref 70–99)
Glucose-Capillary: 149 mg/dL — ABNORMAL HIGH (ref 70–99)
Glucose-Capillary: 150 mg/dL — ABNORMAL HIGH (ref 70–99)
Glucose-Capillary: 152 mg/dL — ABNORMAL HIGH (ref 70–99)
Glucose-Capillary: 155 mg/dL — ABNORMAL HIGH (ref 70–99)
Glucose-Capillary: 158 mg/dL — ABNORMAL HIGH (ref 70–99)
Glucose-Capillary: 192 mg/dL — ABNORMAL HIGH (ref 70–99)
Glucose-Capillary: 193 mg/dL — ABNORMAL HIGH (ref 70–99)
Glucose-Capillary: 216 mg/dL — ABNORMAL HIGH (ref 70–99)
Glucose-Capillary: 219 mg/dL — ABNORMAL HIGH (ref 70–99)
Glucose-Capillary: 240 mg/dL — ABNORMAL HIGH (ref 70–99)
Glucose-Capillary: 273 mg/dL — ABNORMAL HIGH (ref 70–99)
Glucose-Capillary: 275 mg/dL — ABNORMAL HIGH (ref 70–99)
Glucose-Capillary: 284 mg/dL — ABNORMAL HIGH (ref 70–99)

## 2019-07-28 LAB — BASIC METABOLIC PANEL
Anion gap: 10 (ref 5–15)
Anion gap: 13 (ref 5–15)
BUN: 9 mg/dL (ref 6–20)
BUN: 8 mg/dL (ref 6–20)
CO2: 20 mmol/L — ABNORMAL LOW (ref 22–32)
CO2: 20 mmol/L — ABNORMAL LOW (ref 22–32)
Calcium: 8.6 mg/dL — ABNORMAL LOW (ref 8.9–10.3)
Calcium: 8.7 mg/dL — ABNORMAL LOW (ref 8.9–10.3)
Chloride: 104 mmol/L (ref 98–111)
Chloride: 101 mmol/L (ref 98–111)
Creatinine, Ser: 0.87 mg/dL (ref 0.61–1.24)
Creatinine, Ser: 0.9 mg/dL (ref 0.61–1.24)
GFR calc Af Amer: >60 mL/min (ref >60)
GFR calc Af Amer: >60 mL/min (ref >60)
GFR calc non Af Amer: >60 mL/min (ref >60)
GFR calc non Af Amer: >60 mL/min (ref >60)
Glucose, Bld: 144 mg/dL — ABNORMAL HIGH (ref 70–99)
Glucose, Bld: 143 mg/dL — ABNORMAL HIGH (ref 70–99)
Potassium: 3.2 mmol/L — ABNORMAL LOW (ref 3.5–5.1)
Potassium: 2.9 mmol/L — ABNORMAL LOW (ref 3.5–5.1)
Sodium: 134 mmol/L — ABNORMAL LOW (ref 135–145)
Sodium: 134 mmol/L — ABNORMAL LOW (ref 135–145)

## 2019-07-28 LAB — MAGNESIUM: Magnesium: 1.7 mg/dL (ref 1.7–2.4)

## 2019-07-28 LAB — POTASSIUM: Potassium: 3.6 mmol/L (ref 3.5–5.1)

## 2019-07-28 LAB — HEMOGLOBIN A1C
Hgb A1c MFr Bld: >15.5 % — ABNORMAL HIGH (ref 4.8–5.6)
Mean Plasma Glucose: >398 mg/dL

## 2019-07-28 LAB — BETA-HYDROXYBUTYRIC ACID: Beta-Hydroxybutyric Acid: 2.23 mmol/L — ABNORMAL HIGH (ref 0.05–0.27)

## 2019-07-28 MED ORDER — POTASSIUM CHLORIDE 10 MEQ/100ML IV SOLN
10.0000 meq | INTRAVENOUS | Status: AC
  Administered 2019-07-28 (×3): 10 meq via INTRAVENOUS
  Filled 2019-07-28 (×3): qty 100

## 2019-07-28 MED ORDER — INSULIN ASPART 100 UNIT/ML ~~LOC~~ SOLN
0.0000 [IU] | Freq: Three times a day (TID) | SUBCUTANEOUS | Status: DC
  Administered 2019-07-28: 5 [IU] via SUBCUTANEOUS

## 2019-07-28 MED ORDER — ATORVASTATIN CALCIUM 40 MG PO TABS: 40.0000 mg | ORAL_TABLET | Freq: Every day | ORAL | 0 refills | Status: AC

## 2019-07-28 MED ORDER — POTASSIUM CHLORIDE CRYS ER 20 MEQ PO TBCR
40.0000 meq | EXTENDED_RELEASE_TABLET | ORAL | Status: DC
  Administered 2019-07-28 (×2): 40 meq via ORAL
  Filled 2019-07-28 (×2): qty 2

## 2019-07-28 MED ORDER — SODIUM CHLORIDE 0.9 % IV SOLN: INTRAVENOUS | Status: DC

## 2019-07-28 MED ORDER — TRESIBA FLEXTOUCH 100 UNIT/ML ~~LOC~~ SOPN: 45.0000 [IU] | PEN_INJECTOR | Freq: Every day | SUBCUTANEOUS | 0 refills | Status: DC

## 2019-07-28 MED ORDER — INSULIN GLARGINE 100 UNIT/ML ~~LOC~~ SOLN
30.0000 [IU] | Freq: Every day | SUBCUTANEOUS | Status: DC
  Administered 2019-07-28: 11:00:00 30 [IU] via SUBCUTANEOUS
  Filled 2019-07-28 (×2): qty 0.3

## 2019-07-28 MED ORDER — FINGERSTIX LANCETS MISC: 1.0000 | Freq: Two times a day (BID) | 2 refills | Status: AC

## 2019-07-28 MED ORDER — POTASSIUM CHLORIDE CRYS ER 20 MEQ PO TBCR: 40.0000 meq | EXTENDED_RELEASE_TABLET | Freq: Two times a day (BID) | ORAL | Status: DC

## 2019-07-28 MED ORDER — INSULIN ASPART 100 UNIT/ML ~~LOC~~ SOLN: 0.0000 [IU] | Freq: Every day | SUBCUTANEOUS | Status: DC

## 2019-07-28 NOTE — Discharge Summary (Signed)
Physician Discharge Summary  Justin Wilkinson WUX:[id number] DOB: February 14, 1981 DOA: 07/27/2019  PCP: Myrlene Broker, MD  Admit date: 07/27/2019 Discharge date: 07/28/2019  Admitted From: Home Disposition:  Home  Discharge Condition:Stable CODE STATUS:FULL Diet recommendation:  Carb Modified  Brief/Interim Summary: Patient is a 39 year old male with history of diabetes type 1, hypertension, hyperlipidemia who presented with 1 month history of fatigue.  He missed a follow-up appointment with his primary care provider in 2020 December and has been without insulin since that time.  He tried to manage his diabetes with diet control.  Recently has been having polydipsia, urinary frequency, weight loss of approximately 20 pounds.  He was last admitted in 2014 for DKA. Upon presentation he was hemodynamically stable.  Labs were significant with findings of AKI, DKA.  Patient was admitted for the management of DKA, started on insulin drip. Anion  gap has closed.  He has been transitioned to long-acting insulin.  This morning ,patient was very comfortable, hemodynamically stable.  Diet has been started.  He is stable for discharge to home with long-acting insulin.  Prescription sent to the pharmacy.  He should follow up with his PCP soon as possible.  Following problems were addressed during hospitalization:  DKA type I: Totally uncontrolled. Not using insulin for last couple of months due to missed appointment with PCP.  Presented with fatigue, weight loss, urine frequency, polydipsia.  Found to be in DKA on presentation.  Started on insulin drip. Gap has closed.  Long-acting started.  Diabetic coordinator was following.  Continue IV fluids. Hemoglobin A1c of more than 15. We will discharge him on Tresiba that he was taking at home.  I have emphasized the importance of following up with his PCP in 1 to 2 weeks.  AKI: Due to volume depletion  from polyuria.  Resolved with IV fluids  Hypertension:  On presentation.  Currently stable.  Hyperlipidemia: On Lipitor.  Severe hypokalemia: Supplemented with potassium  Discharge Diagnoses:  Active Problems:   DKA, type 1 [facility])    Discharge Instructions  Discharge Instructions    Diet Carb Modified   Complete by: As directed    Discharge instructions   Complete by: As directed    1)Take prescribed medications as instructed. 2)Monitor your blood sugars at home. 3)Follow up with your PCP in the next available appointment   Increase activity slowly   Complete by: As directed      Allergies as of 07/28/2019      Reactions   Fish Allergy Anaphylaxis   Shellfish Allergy Anaphylaxis      Medication List    TAKE these medications   Alcohol Swabs Pads 1 Container by Does not apply route 2 (two) times daily.   atorvastatin 40 MG tablet Commonly known as: LIPITOR Take 1 tablet (40 mg total) by mouth daily.   Fingerstix Lancets Misc 1 Container by Does not apply route 2 (two) times daily.   glucose blood test strip Commonly known as: Accu-Chek Guide 1 each by Other route 4 (four) times daily. And lancets 4/day   MULTIVITAMIN ADULT PO Take 1 tablet by mouth daily.   [name] FlexTouch 100 UNIT/ML FlexTouch Pen Generic drug: insulin degludec Inject 0.45 mLs (45 Units total) into the skin daily. And pen needles 1/day      Follow-up Information    Myrlene Broker, MD. Schedule an appointment as soon as possible for a visit in 1 week(s).   Specialty: Internal Medicine Contact information: [address]  [place] [address] [phone]          Allergies  Allergen Reactions  . Fish Allergy Anaphylaxis  . Shellfish Allergy Anaphylaxis    Consultations:  None   Procedures/Studies: No results found.    Subjective:  Patient seen and examined at  the bedside this morning.  Hemodynamically stable for discharge today.  Discharge Exam: Vitals:   07/28/19 0400 07/28/19 0904  BP:  105/72   Pulse:  88  Resp: (!) 8 10  Temp:    SpO2: 98% 98%   Vitals:   07/28/19 0000 07/28/19 0332 07/28/19 0400 07/28/19 0904  BP:  128/83  105/72  Pulse:    88  Resp: 10 13 (!) 8 10  Temp:  97.8 F (36.6 C)    TempSrc:  Oral    SpO2: 99% 99% 98% 98%  Weight:      Height:        General: Pt is alert, awake, not in acute distress Cardiovascular: RRR, S1/S2 +, no rubs, no gallops Respiratory: CTA bilaterally, no wheezing, no rhonchi Abdominal: Soft, NT, ND, bowel sounds + Extremities: no edema, no cyanosis    The results of significant diagnostics from this hospitalization (including imaging, microbiology, ancillary and laboratory) are listed below for reference.     Microbiology: Recent Results (from the past 240 hour(s))  SARS CORONAVIRUS 2 (TAT 6-24 HRS) Nasopharyngeal Nasopharyngeal Swab     Status: None   Collection Time: 07/27/19  3:57 PM   Specimen: Nasopharyngeal Swab  Result Value Ref Range Status   SARS Coronavirus 2 NEGATIVE NEGATIVE Final    Comment: (NOTE) SARS-CoV-2 target nucleic acids are NOT DETECTED. The SARS-CoV-2 RNA is generally detectable in upper and lower respiratory specimens during the acute phase of infection. Negative results do not preclude SARS-CoV-2 infection, do not rule out co-infections with other pathogens, and should not be used as the sole basis for treatment or other patient management decisions. Negative results must be combined with clinical observations, patient history, and epidemiological information. The expected result is Negative. Fact Sheet for Patients: HairSlick.no Fact Sheet for Healthcare Providers: quierodirigir.com This test is not yet approved or cleared by the [place] FDA and  has been authorized for detection and/or diagnosis of SARS-CoV-2 by FDA under an Emergency Use Authorization (EUA). This EUA will remain  in effect (meaning this test can be used) for  the duration of the COVID-19 declaration under Section 56 4(b)(1) of the Act, 21 U.S.C. section 360bbb-3(b)(1), unless the authorization is terminated or revoked sooner. Performed at [facility], [address] [place], [place] [address]   MRSA PCR Screening     Status: None   Collection Time: 07/27/19  4:46 PM   Specimen: Nasal Mucosa; Nasopharyngeal  Result Value Ref Range Status   MRSA by PCR NEGATIVE NEGATIVE Final    Comment:        The GeneXpert MRSA Assay (FDA approved for NASAL specimens only), is one component of a comprehensive MRSA colonization surveillance program. It is not intended to diagnose MRSA infection nor to guide or monitor treatment for MRSA infections. Performed at [facility], [address] [place], [place] [address]      Labs: BNP (last 3 results) No results for input(s): BNP in the last 8760 hours. Basic Metabolic Panel: Recent Labs  Lab 07/27/19 0726 07/27/19 0726 07/27/19 1306 07/27/19 1306 07/27/19 1634 07/27/19 2014 07/27/19 2349 07/28/19 0411 07/28/19 0856 07/28/19 1224  NA 135   < >  130*  --  139 135 134* 134*  --   --   K 4.2   < > 3.9   < > 3.4* 3.3* 3.2* 2.9*  --  3.6  CL 100  --   --   --  109 106 104 101  --   --   CO2 15*  --   --   --  19* 19* 20* 20*  --   --   GLUCOSE 309*  --   --   --  185* 247* 144* 143*  --   --   BUN 15  --   --   --  9 9 9 8   --   --   CREATININE 1.31*  --   --   --  1.22 1.06 0.87 0.90  --   --   CALCIUM 9.9  --   --   --  8.8* 8.6* 8.6* 8.7*  --   --   MG  --   --   --   --  1.9  --   --   --  1.7  --    < > = values in this interval not displayed.   Liver Function Tests: Recent Labs  Lab 07/27/19 0726  AST 12*  ALT 18  ALKPHOS 62  BILITOT 1.7*  PROT 8.3*  ALBUMIN 3.8   No results for input(s): LIPASE, AMYLASE in the last 168 hours. No results for input(s): AMMONIA in the last 168 hours. CBC: Recent Labs  Lab 07/27/19 0726 07/27/19 1306  WBC 7.0  --   HGB 16.4  15.6  HCT 47.1 46.0  MCV 88.5  --   PLT 292  --    Cardiac Enzymes: No results for input(s): CKTOTAL, CKMB, CKMBINDEX, TROPONINI in the last 168 hours. BNP: Invalid input(s): POCBNP CBG: Recent Labs  Lab 07/28/19 0756 07/28/19 0838 07/28/19 0953 07/28/19 1110 07/28/19 1211  GLUCAP 284* 275* 216* 192* 219*   D-Dimer No results for input(s): DDIMER in the last 72 hours. Hgb A1c Recent Labs    07/27/19 1634  HGBA1C >15.5*   Lipid Profile No results for input(s): CHOL, HDL, LDLCALC, TRIG, CHOLHDL, LDLDIRECT in the last 72 hours. Thyroid function studies No results for input(s): TSH, T4TOTAL, T3FREE, THYROIDAB in the last 72 hours.  Invalid input(s): FREET3 Anemia work up No results for input(s): VITAMINB12, FOLATE, FERRITIN, TIBC, IRON, RETICCTPCT in the last 72 hours. Urinalysis    Component Value Date/Time   COLORURINE STRAW (A) 07/27/2019 1304   APPEARANCEUR CLEAR 07/27/2019 1304   LABSPEC 1.028 07/27/2019 1304   PHURINE 6.0 07/27/2019 1304   GLUCOSEU >=500 (A) 07/27/2019 1304   HGBUR NEGATIVE 07/27/2019 1304   BILIRUBINUR NEGATIVE 07/27/2019 1304   KETONESUR 20 (A) 07/27/2019 1304   PROTEINUR NEGATIVE 07/27/2019 1304   UROBILINOGEN 0.2 12/15/2012 1145   NITRITE NEGATIVE 07/27/2019 1304   LEUKOCYTESUR NEGATIVE 07/27/2019 1304   Sepsis Labs Invalid input(s): PROCALCITONIN,  WBC,  LACTICIDVEN Microbiology Recent Results (from the past 240 hour(s))  SARS CORONAVIRUS 2 (TAT 6-24 HRS) Nasopharyngeal Nasopharyngeal Swab     Status: None   Collection Time: 07/27/19  3:57 PM   Specimen: Nasopharyngeal Swab  Result Value Ref Range Status   SARS Coronavirus 2 NEGATIVE NEGATIVE Final    Comment: (NOTE) SARS-CoV-2 target nucleic acids are NOT DETECTED. The SARS-CoV-2 RNA is generally detectable in upper and lower respiratory specimens during the acute phase of infection. Negative results do not preclude SARS-CoV-2 infection, do  not rule out co-infections with other  pathogens, and should not be used as the sole basis for treatment or other patient management decisions. Negative results must be combined with clinical observations, patient history, and epidemiological information. The expected result is Negative. Fact Sheet for Patients: SugarRoll.be Fact Sheet for Healthcare Providers: https://www.woods-mathews.com/ This test is not yet approved or cleared by the [place] FDA and  has been authorized for detection and/or diagnosis of SARS-CoV-2 by FDA under an Emergency Use Authorization (EUA). This EUA will remain  in effect (meaning this test can be used) for the duration of the COVID-19 declaration under Section 56 4(b)(1) of the Act, 21 U.S.C. section 360bbb-3(b)(1), unless the authorization is terminated or revoked sooner. Performed at [facility], [place] [address], [place], [place] [address]   MRSA PCR Screening     Status: None   Collection Time: 07/27/19  4:46 PM   Specimen: Nasal Mucosa; Nasopharyngeal  Result Value Ref Range Status   MRSA by PCR NEGATIVE NEGATIVE Final    Comment:        The GeneXpert MRSA Assay (FDA approved for NASAL specimens only), is one component of a comprehensive MRSA colonization surveillance program. It is not intended to diagnose MRSA infection nor to guide or monitor treatment for MRSA infections. Performed at [facility], [place] [address], [place], [place] [address]     Please note: You were cared for by a hospitalist during your hospital stay. Once you are discharged, your primary care physician will handle any further medical issues. Please note that NO REFILLS for any discharge medications will be authorized once you are discharged, as it is imperative that you return to your primary care physician (or establish a relationship with a primary care physician if you do not have one) for your post hospital discharge needs so that they can reassess  your need for medications and monitor your lab values.    Time coordinating discharge: 40 minutes  SIGNED:   Shelly Coss, MD  [facility] 07/28/2019, 1:41 PM Pager [phone]  If 7PM-7AM, please contact night-coverage [email or web address] Password [email or web address]

## 2019-07-28 NOTE — Progress Notes (Signed)
Spoke with patient at the bedside. States that he was diagnosed with diabetes about 6 years ago and has been on insulin since then. Started on 70/30 insulin, but has been on [place] 45 units daily recently. Had been out of [place] since December, 2020 and missed his appointment with endocrinologist, Dr. Everardo All, and did not get it refilled.  Discussed HgbA1C of 15.5%, showing that it had increased due to not taking his insulin. Suggested that he check blood sugars at least twice a day. States that he does have a home blood glucose meter and strips at home. Suggested to patient that he can get 70/30 insulin pens at [facility] if he runs out of insulin again, without a prescription. Would need to know the amount to take as a backup.   Patient states that he has an appointment with Dr. Everardo All on April 12 for follow up.   Will continue to monitor blood sugars while in the hospital.  Smith Mince RN BSN CDE Diabetes Coordinator Pager: [phone]  8am-5pm

## 2019-07-28 NOTE — Plan of Care (Signed)
  Problem: Education: Goal: Knowledge of General Education information will improve Description: Including pain rating scale, medication(s)/side effects and non-pharmacologic comfort measures Outcome: Progressing   Problem: Health Behavior/Discharge Planning: Goal: Ability to manage health-related needs will improve Outcome: Progressing   Problem: Clinical Measurements: Goal: Will remain free from infection Outcome: Progressing Goal: Respiratory complications will improve Outcome: Progressing   Problem: Nutrition: Goal: Adequate nutrition will be maintained Outcome: Progressing   Problem: Coping: Goal: Level of anxiety will decrease Outcome: Progressing   Problem: Safety: Goal: Ability to remain free from injury will improve Outcome: Progressing   

## 2019-07-28 NOTE — Progress Notes (Signed)
[facility] notified of K+ 2.9 this morning. Orders received for of potassium x 3 IV doses and then follow with PO doses. Patient tolerating IV dosing OK so far. Will continue to monitor patient closely.

## 2019-07-31 LAB — HEMOGLOBIN A1C
Hgb A1c MFr Bld: >15.5 % — ABNORMAL HIGH (ref 4.8–5.6)
Mean Plasma Glucose: >398 mg/dL

## 2019-08-01 DIAGNOSIS — R000 Tachycardia, unspecified: Secondary | ICD-10-CM | POA: Diagnosis not present

## 2019-08-01 DIAGNOSIS — E119 Type 2 diabetes mellitus without complications: Secondary | ICD-10-CM | POA: Diagnosis not present

## 2019-08-14 ENCOUNTER — Ambulatory Visit: Payer: BC Managed Care – PPO | Admitting: Endocrinology

## 2019-08-14 ENCOUNTER — Other Ambulatory Visit: Payer: Self-pay

## 2019-08-14 ENCOUNTER — Encounter: Payer: Self-pay | Admitting: Endocrinology

## 2019-08-14 VITALS — BP 110/80 | HR 94 | Ht 68.0 in | Wt 184.2 lb

## 2019-08-14 DIAGNOSIS — E1065 Type 1 diabetes mellitus with hyperglycemia: Secondary | ICD-10-CM | POA: Diagnosis not present

## 2019-08-14 DIAGNOSIS — E108 Type 1 diabetes mellitus with unspecified complications: Secondary | ICD-10-CM | POA: Diagnosis not present

## 2019-08-14 DIAGNOSIS — IMO0002 Reserved for concepts with insufficient information to code with codable children: Secondary | ICD-10-CM

## 2019-08-14 MED ORDER — TRESIBA FLEXTOUCH 100 UNIT/ML ~~LOC~~ SOPN: 50.0000 [IU] | PEN_INJECTOR | Freq: Every day | SUBCUTANEOUS | 3 refills | Status: AC

## 2019-08-14 NOTE — Progress Notes (Signed)
Subjective:    Patient ID: Justin Wilkinson, male    DOB: [birth year], 39 y.o.   MRN: [id number]  HPI Pt returns for f/u of diabetes mellitus:  DM type: 1 Dx'ed: 2014 Complications: none. Therapy: insulin since dx. DKA: only at dx. Severe hypoglycemia: never.  Pancreatitis: never.  Other: he declines multiple daily injections; high A1c has been confirmed with fructosamine.    Interval history:  He was admitted with DKA 2 weeks ago, after being off the insulin for several months.  Back on the insulin, pt states he feels well in general.  Pt says he no longer misses the insulin.  no cbg record, but states cbg's vary from 140-275.  Past Medical History:  Diagnosis Date  . Diabetes mellitus without complication (HCC)   . Hyperlipidemia   . Hypertension     No past surgical history on file.  Social History   Socioeconomic History  . Marital status: Single    Spouse name: Not on file  . Number of children: Not on file  . Years of education: Not on file  . Highest education level: Not on file  Occupational History  . Not on file  Tobacco Use  . Smoking status: Former Smoker    Packs/day: 0.50    Types: Cigarettes    Quit date: 12/15/2012    Years since quitting: 6.6  . Smokeless tobacco: Never Used  Substance and Sexual Activity  . Alcohol use: No  . Drug use: No  . Sexual activity: Never  Other Topics Concern  . Not on file  Social History Narrative  . Not on file   Social Determinants of Health   Financial Resource Strain:   . Difficulty of Paying Living Expenses:   Food Insecurity:   . Worried About Programme researcher, broadcasting/film/video in the Last Year:   . Barista in the Last Year:   Transportation Needs:   . Freight forwarder (Medical):   Marland Kitchen Lack of Transportation (Non-Medical):   Physical Activity:   . Days of Exercise per Week:   . Minutes of Exercise per Session:   Stress:   . Feeling of Stress :   Social Connections:   . Frequency of Communication with  Friends and Family:   . Frequency of Social Gatherings with Friends and Family:   . Attends Religious Services:   . Active Member of Clubs or Organizations:   . Attends Banker Meetings:   Marland Kitchen Marital Status:   Intimate Partner Violence:   . Fear of Current or Ex-Partner:   . Emotionally Abused:   Marland Kitchen Physically Abused:   . Sexually Abused:     Current Outpatient Medications on File Prior to Visit  Medication Sig Dispense Refill  . Alcohol Swabs PADS 1 Container by Does not apply route 2 (two) times daily. 100 each 12  . atorvastatin (LIPITOR) 40 MG tablet Take 1 tablet (40 mg total) by mouth daily. 60 tablet 0  . Fingerstix Lancets MISC 1 Container by Does not apply route 2 (two) times daily. 200 each 2  . glucose blood (ACCU-CHEK GUIDE) test strip 1 each by Other route 4 (four) times daily. And lancets 4/day 100 each 12  . Multiple Vitamin (MULTIVITAMIN ADULT PO) Take 1 tablet by mouth daily.     No current facility-administered medications on file prior to visit.    Allergies  Allergen Reactions  . Fish Allergy Anaphylaxis  . Shellfish Allergy Anaphylaxis  Family History  Problem Relation Age of Onset  . Lung cancer Father   . Diabetes Maternal Grandmother     BP 110/80 (BP Location: Left Arm, Patient Position: Sitting, Cuff Size: Normal)   Pulse 94   Ht 5\' 8"  (1.727 m)   Wt 184 lb 3.2 oz (83.6 kg)   SpO2 99%   BMI 28.01 kg/m    Review of Systems He has regained the weight he lost.  He denies hypoglycemia.      Objective:   Physical Exam VITAL SIGNS:  See vs page GENERAL: no distress Pulses: dorsalis pedis intact bilat.   MSK: no deformity of the feet CV: no leg edema Skin:  no ulcer on the feet.  normal color and temp on the feet. Neuro: sensation is intact to touch on the feet.    Lab Results  Component Value Date   HGBA1C >15.5 (H) 07/28/2019       Assessment & Plan:  Type 1 DM: severe exacerbation Noncompliance with insulin.  Pt  advised he must stay on insulin, for his safety.  Patient Instructions  check your blood sugar 4 times a day: before the 3 meals, and at bedtime.  also check if you have symptoms of your blood sugar being too high or too low.  please keep a record of the readings and bring it to your next appointment here (or you can bring the meter itself).  You can write it on any piece of paper.  please call us sooner if your blood sugar goes below 70, or if you have a lot of readings over 200.   Please increase the insulin to 50 units daily.  I have sent a prescription to your pharmacy.  Please come back for a follow-up appointment in 2 months.

## 2019-08-14 NOTE — Patient Instructions (Addendum)
check your blood sugar 4 times a day: before the 3 meals, and at bedtime.  also check if you have symptoms of your blood sugar being too high or too low.  please keep a record of the readings and bring it to your next appointment here (or you can bring the meter itself).  You can write it on any piece of paper.  please call us sooner if your blood sugar goes below 70, or if you have a lot of readings over 200.   Please increase the insulin to 50 units daily.  I have sent a prescription to your pharmacy.  Please come back for a follow-up appointment in 2 months.

## 2019-10-23 ENCOUNTER — Ambulatory Visit: Payer: BC Managed Care – PPO | Admitting: Endocrinology

## 2019-11-20 ENCOUNTER — Ambulatory Visit (INDEPENDENT_AMBULATORY_CARE_PROVIDER_SITE_OTHER): Payer: BC Managed Care – PPO | Admitting: Endocrinology

## 2019-11-20 ENCOUNTER — Other Ambulatory Visit: Payer: Self-pay

## 2019-11-20 VITALS — BP 132/96 | HR 80 | Ht 67.99 in | Wt 196.8 lb

## 2019-11-20 DIAGNOSIS — E108 Type 1 diabetes mellitus with unspecified complications: Secondary | ICD-10-CM

## 2019-11-20 DIAGNOSIS — E1065 Type 1 diabetes mellitus with hyperglycemia: Secondary | ICD-10-CM

## 2019-11-20 DIAGNOSIS — IMO0002 Reserved for concepts with insufficient information to code with codable children: Secondary | ICD-10-CM

## 2019-11-20 LAB — POCT GLYCOSYLATED HEMOGLOBIN (HGB A1C): Hemoglobin A1C: 9.1 % — AB (ref 4.0–5.6)

## 2019-11-20 MED ORDER — LANTUS SOLOSTAR 100 UNIT/ML ~~LOC~~ SOPN: 45.0000 [IU] | PEN_INJECTOR | SUBCUTANEOUS | 11 refills | Status: DC

## 2019-11-20 NOTE — Progress Notes (Signed)
Subjective:    Patient ID: Justin Wilkinson, male    DOB: December 24, 1980, 39 y.o.   MRN: [id number]  HPI Pt returns for f/u of diabetes mellitus:  DM type: 1 Dx'ed: 2014 Complications: none. Therapy: insulin since dx. DKA: at dx, and 2021 Severe hypoglycemia: never.  Pancreatitis: never.  Other: he declines multiple daily injections; high A1c has been confirmed with fructosamine.    Interval history: pt states he feels well in general.  Pt says he does not miss the insulin.  no cbg record, but states cbg's vary from 86-120.  He says Justin Wilkinson has increased to $120, even with discount card.   Past Medical History:  Diagnosis Date  . Diabetes mellitus without complication (HCC)   . Hyperlipidemia   . Hypertension     No past surgical history on file.  Social History   Socioeconomic History  . Marital status: Single    Spouse name: Not on file  . Number of children: Not on file  . Years of education: Not on file  . Highest education level: Not on file  Occupational History  . Not on file  Tobacco Use  . Smoking status: Former Smoker    Packs/day: 0.50    Types: Cigarettes    Quit date: 12/15/2012    Years since quitting: 6.9  . Smokeless tobacco: Never Used  Substance and Sexual Activity  . Alcohol use: No  . Drug use: No  . Sexual activity: Never  Other Topics Concern  . Not on file  Social History Narrative  . Not on file   Social Determinants of Health   Financial Resource Strain:   . Difficulty of Paying Living Expenses:   Food Insecurity:   . Worried About Programme researcher, broadcasting/film/video in the Last Year:   . Barista in the Last Year:   Transportation Needs:   . Freight forwarder (Medical):   Marland Kitchen Lack of Transportation (Non-Medical):   Physical Activity:   . Days of Exercise per Week:   . Minutes of Exercise per Session:   Stress:   . Feeling of Stress :   Social Connections:   . Frequency of Communication with Friends and Family:   . Frequency of Social  Gatherings with Friends and Family:   . Attends Religious Services:   . Active Member of Clubs or Organizations:   . Attends Banker Meetings:   Marland Kitchen Marital Status:   Intimate Partner Violence:   . Fear of Current or Ex-Partner:   . Emotionally Abused:   Marland Kitchen Physically Abused:   . Sexually Abused:     Current Outpatient Medications on File Prior to Visit  Medication Sig Dispense Refill  . Alcohol Swabs PADS 1 Container by Does not apply route 2 (two) times daily. 100 each 12  . atorvastatin (LIPITOR) 40 MG tablet Take 1 tablet (40 mg total) by mouth daily. 60 tablet 0  . Fingerstix Lancets MISC 1 Container by Does not apply route 2 (two) times daily. 200 each 2  . glucose blood (ACCU-CHEK GUIDE) test strip 1 each by Other route 4 (four) times daily. And lancets 4/day 100 each 12  . Multiple Vitamin (MULTIVITAMIN ADULT PO) Take 1 tablet by mouth daily.     No current facility-administered medications on file prior to visit.    Allergies  Allergen Reactions  . Fish Allergy Anaphylaxis  . Shellfish Allergy Anaphylaxis    Family History  Problem Relation Age of Onset  .  Lung cancer Father   . Diabetes Maternal Grandmother     BP (!) 132/96 (BP Location: Left Arm, Patient Position: Sitting)   Pulse 80   Ht 5' 7.99" (1.727 m)   Wt 196 lb 12.8 oz (89.3 kg)   SpO2 95%   BMI 29.93 kg/m    Review of Systems He denies hypoglycemia.      Objective:   Physical Exam VITAL SIGNS:  See vs page GENERAL: no distress Pulses: dorsalis pedis intact bilat.   MSK: no deformity of the feet CV: no leg edema Skin:  no ulcer on the feet.  normal color and temp on the feet. Neuro: sensation is intact to touch on the feet  Lab Results  Component Value Date   HGBA1C 9.1 (A) 11/20/2019        Assessment & Plan:  HTN: is noted today Type 1 DM: he needs increased rx  Patient Instructions  Your blood pressure is high today.  Please see your primary care provider soon, to  have it rechecked.   check your blood sugar 4 times a day: before the 3 meals, and at bedtime.  also check if you have symptoms of your blood sugar being too high or too low.  please keep a record of the readings and bring it to your next appointment here (or you can bring the meter itself).  You can write it on any piece of paper.  please call us sooner if your blood sugar goes below 70, or if you have a lot of readings over 200.   I have sent a prescription to your pharmacy, to change Tresiba to Lantus.  The first day, take just 30 units.  Please come back for a follow-up appointment in 2 months.

## 2019-11-20 NOTE — Patient Instructions (Addendum)
Your blood pressure is high today.  Please see your primary care provider soon, to have it rechecked.   check your blood sugar 4 times a day: before the 3 meals, and at bedtime.  also check if you have symptoms of your blood sugar being too high or too low.  please keep a record of the readings and bring it to your next appointment here (or you can bring the meter itself).  You can write it on any piece of paper.  please call us sooner if your blood sugar goes below 70, or if you have a lot of readings over 200.   I have sent a prescription to your pharmacy, to change Tresiba to Lantus.  The first day, take just 30 units.  Please come back for a follow-up appointment in 2 months.

## 2020-01-22 ENCOUNTER — Ambulatory Visit: Payer: BC Managed Care – PPO | Admitting: Endocrinology

## 2020-02-26 ENCOUNTER — Other Ambulatory Visit: Payer: Self-pay

## 2020-02-26 ENCOUNTER — Encounter: Payer: Self-pay | Admitting: Endocrinology

## 2020-02-26 ENCOUNTER — Ambulatory Visit: Payer: BC Managed Care – PPO | Admitting: Endocrinology

## 2020-02-26 VITALS — BP 142/92 | HR 94 | Ht 67.0 in | Wt 192.8 lb

## 2020-02-26 DIAGNOSIS — E108 Type 1 diabetes mellitus with unspecified complications: Secondary | ICD-10-CM | POA: Diagnosis not present

## 2020-02-26 DIAGNOSIS — IMO0002 Reserved for concepts with insufficient information to code with codable children: Secondary | ICD-10-CM

## 2020-02-26 DIAGNOSIS — E1065 Type 1 diabetes mellitus with hyperglycemia: Secondary | ICD-10-CM

## 2020-02-26 LAB — POCT GLYCOSYLATED HEMOGLOBIN (HGB A1C): Hemoglobin A1C: 13.6 % — AB (ref 4.0–5.6)

## 2020-02-26 MED ORDER — LANTUS SOLOSTAR 100 UNIT/ML ~~LOC~~ SOPN
50.0000 [IU] | PEN_INJECTOR | SUBCUTANEOUS | 3 refills | Status: DC
Start: 2020-02-26 — End: 2020-07-08

## 2020-02-26 MED ORDER — ACCU-CHEK GUIDE VI STRP: 1.0000 | ORAL_STRIP | Freq: Four times a day (QID) | 3 refills | Status: AC

## 2020-02-26 NOTE — Patient Instructions (Addendum)
Your blood pressure is high today.  Please see your primary care provider soon, to have it rechecked.   check your blood sugar 4 times a day: before the 3 meals, and at bedtime.  also check if you have symptoms of your blood sugar being too high or too low.  please keep a record of the readings and bring it to your next appointment here (or you can bring the meter itself).  You can write it on any piece of paper.  please call us sooner if your blood sugar goes below 70, or if you have a lot of readings over 200.   Here is a new meter.  I have sent a prescription to your pharmacy, for strips.  I have sent a prescription to your pharmacy, to increase the Lantus.   Please come back for a follow-up appointment in 3 months.

## 2020-02-26 NOTE — Progress Notes (Signed)
Subjective:    Patient ID: Justin Wilkinson, male    DOB: [birth year], 39 y.o.   MRN: [id number]  HPI Pt returns for f/u of diabetes mellitus:  DM type: 1 Dx'ed: 2014 Complications: none. Therapy: insulin since dx. DKA: at dx, and 2021 Severe hypoglycemia: never.  Pancreatitis: never.  Other: he declines multiple daily injections; high A1c has been confirmed with fructosamine; ins declined [place].   Interval history: pt states he feels well in general.  Pt says he does not miss the insulin, but he has not recently checked cbg.  He requests a new meter.   Past Medical History:  Diagnosis Date  . Diabetes mellitus without complication (HCC)   . Hyperlipidemia   . Hypertension     No past surgical history on file.  Social History   Socioeconomic History  . Marital status: Single    Spouse name: Not on file  . Number of children: Not on file  . Years of education: Not on file  . Highest education level: Not on file  Occupational History  . Not on file  Tobacco Use  . Smoking status: Former Smoker    Packs/day: 0.50    Types: Cigarettes    Quit date: 12/15/2012    Years since quitting: 7.2  . Smokeless tobacco: Never Used  Substance and Sexual Activity  . Alcohol use: No  . Drug use: No  . Sexual activity: Never  Other Topics Concern  . Not on file  Social History Narrative  . Not on file   Social Determinants of Health   Financial Resource Strain:   . Difficulty of Paying Living Expenses: Not on file  Food Insecurity:   . Worried About Programme researcher, broadcasting/film/video in the Last Year: Not on file  . Ran Out of Food in the Last Year: Not on file  Transportation Needs:   . Lack of Transportation (Medical): Not on file  . Lack of Transportation (Non-Medical): Not on file  Physical Activity:   . Days of Exercise per Week: Not on file  . Minutes of Exercise per Session: Not on file  Stress:   . Feeling of Stress : Not on file  Social Connections:   . Frequency of  Communication with Friends and Family: Not on file  . Frequency of Social Gatherings with Friends and Family: Not on file  . Attends Religious Services: Not on file  . Active Member of Clubs or Organizations: Not on file  . Attends Banker Meetings: Not on file  . Marital Status: Not on file  Intimate Partner Violence:   . Fear of Current or Ex-Partner: Not on file  . Emotionally Abused: Not on file  . Physically Abused: Not on file  . Sexually Abused: Not on file    Current Outpatient Medications on File Prior to Visit  Medication Sig Dispense Refill  . Alcohol Swabs PADS 1 Container by Does not apply route 2 (two) times daily. 100 each 12  . atorvastatin (LIPITOR) 40 MG tablet Take 1 tablet (40 mg total) by mouth daily. 60 tablet 0  . Fingerstix Lancets MISC 1 Container by Does not apply route 2 (two) times daily. 200 each 2  . Multiple Vitamin (MULTIVITAMIN ADULT PO) Take 1 tablet by mouth daily.     No current facility-administered medications on file prior to visit.    Allergies  Allergen Reactions  . Fish Allergy Anaphylaxis  . Shellfish Allergy Anaphylaxis    Family History  Problem Relation Age of Onset  . Lung cancer Father   . Diabetes Maternal Grandmother     BP (!) 142/92 (BP Location: Left Arm, Patient Position: Sitting, Cuff Size: Normal)   Pulse 94   Ht 5\' 7"  (1.702 m)   Wt 192 lb 12.8 oz (87.5 kg)   SpO2 96%   BMI 30.20 kg/m    Review of Systems     Objective:   Physical Exam VITAL SIGNS:  See vs page GENERAL: no distress Pulses: dorsalis pedis intact bilat.   MSK: no deformity of the feet CV: no leg edema Skin:  no ulcer on the feet.  normal color and temp on the feet. Neuro: sensation is intact to touch on the feet.   Lab Results  Component Value Date   HGBA1C 13.6 (A) 02/26/2020       Assessment & Plan:  HTN: is noted today.   Type 1 DM: severe exacerbation.   Noncompliance with cbg recording (and probably with  insulin)  Patient Instructions  Your blood pressure is high today.  Please see your primary care provider soon, to have it rechecked.   check your blood sugar 4 times a day: before the 3 meals, and at bedtime.  also check if you have symptoms of your blood sugar being too high or too low.  please keep a record of the readings and bring it to your next appointment here (or you can bring the meter itself).  You can write it on any piece of paper.  please call 02/28/2020 sooner if your blood sugar goes below 70, or if you have a lot of readings over 200.   Here is a new meter.  I have sent a prescription to your pharmacy, for strips.  I have sent a prescription to your pharmacy, to increase the Lantus.   Please come back for a follow-up appointment in 3 months.

## 2020-06-03 ENCOUNTER — Ambulatory Visit: Payer: BC Managed Care – PPO | Admitting: Endocrinology

## 2020-07-04 ENCOUNTER — Other Ambulatory Visit: Payer: Self-pay

## 2020-07-08 ENCOUNTER — Other Ambulatory Visit: Payer: Self-pay

## 2020-07-08 ENCOUNTER — Ambulatory Visit (INDEPENDENT_AMBULATORY_CARE_PROVIDER_SITE_OTHER): Payer: 59 | Admitting: Endocrinology

## 2020-07-08 VITALS — BP 130/90 | HR 92 | Ht 68.0 in | Wt 191.8 lb

## 2020-07-08 DIAGNOSIS — E1065 Type 1 diabetes mellitus with hyperglycemia: Secondary | ICD-10-CM | POA: Diagnosis not present

## 2020-07-08 DIAGNOSIS — E108 Type 1 diabetes mellitus with unspecified complications: Secondary | ICD-10-CM | POA: Diagnosis not present

## 2020-07-08 DIAGNOSIS — IMO0002 Reserved for concepts with insufficient information to code with codable children: Secondary | ICD-10-CM

## 2020-07-08 MED ORDER — LANTUS SOLOSTAR 100 UNIT/ML ~~LOC~~ SOPN
55.0000 [IU] | PEN_INJECTOR | SUBCUTANEOUS | 3 refills | Status: DC
Start: 2020-07-08 — End: 2021-01-20

## 2020-07-08 NOTE — Progress Notes (Signed)
Subjective:    Patient ID: Justin Wilkinson, male    DOB: 1980-05-11, 40 y.o.   MRN: [id number]  HPI Pt returns for f/u of diabetes mellitus:  DM type: 1 Dx'ed: 2014 Complications: none. Therapy: insulin since dx.   DKA: at dx, and 2021 Severe hypoglycemia: never.  Pancreatitis: never.  Other: he declines multiple daily injections; high A1c has been confirmed with fructosamine; ins declined [place].   Interval history: pt states he feels well in general.  Pt says he does not miss the insulin, but he has not recently checked cbg.    Past Medical History:  Diagnosis Date  . Diabetes mellitus without complication (HCC)   . Hyperlipidemia   . Hypertension     No past surgical history on file.  Social History   Socioeconomic History  . Marital status: Single    Spouse name: Not on file  . Number of children: Not on file  . Years of education: Not on file  . Highest education level: Not on file  Occupational History  . Not on file  Tobacco Use  . Smoking status: Former Smoker    Packs/day: 0.50    Types: Cigarettes    Quit date: 12/15/2012    Years since quitting: 7.5  . Smokeless tobacco: Never Used  Substance and Sexual Activity  . Alcohol use: No  . Drug use: No  . Sexual activity: Never  Other Topics Concern  . Not on file  Social History Narrative  . Not on file   Social Determinants of Health   Financial Resource Strain: Not on file  Food Insecurity: Not on file  Transportation Needs: Not on file  Physical Activity: Not on file  Stress: Not on file  Social Connections: Not on file  Intimate Partner Violence: Not on file    Current Outpatient Medications on File Prior to Visit  Medication Sig Dispense Refill  . Alcohol Swabs PADS 1 Container by Does not apply route 2 (two) times daily. 100 each 12  . atorvastatin (LIPITOR) 40 MG tablet Take 1 tablet (40 mg total) by mouth daily. 60 tablet 0  . Fingerstix Lancets MISC 1 Container by Does not apply route 2  (two) times daily. 200 each 2  . glucose blood (ACCU-CHEK GUIDE) test strip 1 each by Other route 4 (four) times daily. And lancets 4/day 360 each 3  . Multiple Vitamin (MULTIVITAMIN ADULT PO) Take 1 tablet by mouth daily.     No current facility-administered medications on file prior to visit.    Allergies  Allergen Reactions  . Fish Allergy Anaphylaxis  . Shellfish Allergy Anaphylaxis    Family History  Problem Relation Age of Onset  . Lung cancer Father   . Diabetes Maternal Grandmother     BP 130/90 (BP Location: Right Arm, Patient Position: Sitting, Cuff Size: Normal)   Pulse 92   Ht 5\' 8"  (1.727 m)   Wt 191 lb 12.8 oz (87 kg)   SpO2 97%   BMI 29.16 kg/m    Review of Systems He denies hypoglycemia    Objective:   Physical Exam VITAL SIGNS:  See vs page GENERAL: no distress Pulses: dorsalis pedis intact bilat.   MSK: no deformity of the feet CV: no leg edema Skin:  no ulcer on the feet.  normal color and temp on the feet.   Neuro: sensation is intact to touch on the feet.    A1c=11.1%     Assessment & Plan:  Type 1 DM: uncontrolled.  Noncompliance with cbg recording: we'll have to therefore increase insulin slowly.  Patient Instructions  check your blood sugar 4 times a day: before the 3 meals, and at bedtime.  also check if you have symptoms of your blood sugar being too high or too low.  please keep a record of the readings and bring it to your next appointment here (or you can bring the meter itself).  You can write it on any piece of paper.  please call us sooner if your blood sugar goes below 70, or if you have a lot of readings over 200.   I have sent a prescription to your pharmacy, to increase the Lantus.   Please come back for a follow-up appointment in 2-3 months.

## 2020-07-08 NOTE — Patient Instructions (Addendum)
check your blood sugar 4 times a day: before the 3 meals, and at bedtime.  also check if you have symptoms of your blood sugar being too high or too low.  please keep a record of the readings and bring it to your next appointment here (or you can bring the meter itself).  You can write it on any piece of paper.  please call us sooner if your blood sugar goes below 70, or if you have a lot of readings over 200.   I have sent a prescription to your pharmacy, to increase the Lantus.   Please come back for a follow-up appointment in 2-3 months.

## 2020-10-21 ENCOUNTER — Ambulatory Visit: Payer: 59 | Admitting: Endocrinology

## 2021-01-20 ENCOUNTER — Other Ambulatory Visit: Payer: Self-pay

## 2021-01-20 ENCOUNTER — Ambulatory Visit (INDEPENDENT_AMBULATORY_CARE_PROVIDER_SITE_OTHER): Payer: 59 | Admitting: Endocrinology

## 2021-01-20 VITALS — BP 126/84 | HR 103 | Ht 68.0 in | Wt 199.6 lb

## 2021-01-20 DIAGNOSIS — IMO0002 Reserved for concepts with insufficient information to code with codable children: Secondary | ICD-10-CM

## 2021-01-20 DIAGNOSIS — E108 Type 1 diabetes mellitus with unspecified complications: Secondary | ICD-10-CM

## 2021-01-20 DIAGNOSIS — E1065 Type 1 diabetes mellitus with hyperglycemia: Secondary | ICD-10-CM | POA: Diagnosis not present

## 2021-01-20 LAB — POCT GLYCOSYLATED HEMOGLOBIN (HGB A1C): Hemoglobin A1C: 6.6 % — AB (ref 4.0–5.6)

## 2021-01-20 MED ORDER — LANTUS SOLOSTAR 100 UNIT/ML ~~LOC~~ SOPN
53.0000 [IU] | PEN_INJECTOR | SUBCUTANEOUS | 3 refills | Status: DC
Start: 2021-01-20 — End: 2021-09-16

## 2021-01-20 NOTE — Patient Instructions (Signed)
check your blood sugar 4 times a day: before the 3 meals, and at bedtime.  also check if you have symptoms of your blood sugar being too high or too low.  please keep a record of the readings and bring it to your next appointment here (or you can bring the meter itself).  You can write it on any piece of paper.  please call us sooner if your blood sugar goes below 70, or if you have a lot of readings over 200.   I have sent a prescription to your pharmacy, to reduce the Lantus.   Please come back for a follow-up appointment in January.

## 2021-01-20 NOTE — Progress Notes (Signed)
Subjective:    Patient ID: Justin Wilkinson, male    DOB: 06/22/1980, 40 y.o.   MRN: [id number]  HPI Pt returns for f/u of diabetes mellitus:  DM type: 1 Dx'ed: 2014 Complications: none. Therapy: insulin since dx.   DKA: at dx, and 2021 Severe hypoglycemia: never.  Pancreatitis: never.  SDOH: he does not check cbg Other: he declines multiple daily injections; high A1c has been confirmed with fructosamine; ins declined [place].   Interval history: pt states he feels well in general.  Pt says he does not miss the insulin.  Past Medical History:  Diagnosis Date   Diabetes mellitus without complication (HCC)    Hyperlipidemia    Hypertension     No past surgical history on file.  Social History   Socioeconomic History   Marital status: Single    Spouse name: Not on file   Number of children: Not on file   Years of education: Not on file   Highest education level: Not on file  Occupational History   Not on file  Tobacco Use   Smoking status: Former    Packs/day: 0.50    Types: Cigarettes    Quit date: 12/15/2012    Years since quitting: 8.1   Smokeless tobacco: Never  Substance and Sexual Activity   Alcohol use: No   Drug use: No   Sexual activity: Never  Other Topics Concern   Not on file  Social History Narrative   Not on file   Social Determinants of Health   Financial Resource Strain: Not on file  Food Insecurity: Not on file  Transportation Needs: Not on file  Physical Activity: Not on file  Stress: Not on file  Social Connections: Not on file  Intimate Partner Violence: Not on file    Current Outpatient Medications on File Prior to Visit  Medication Sig Dispense Refill   Alcohol Swabs PADS 1 Container by Does not apply route 2 (two) times daily. 100 each 12   atorvastatin (LIPITOR) 40 MG tablet Take 1 tablet (40 mg total) by mouth daily. 60 tablet 0   Fingerstix Lancets MISC 1 Container by Does not apply route 2 (two) times daily. 200 each 2    glucose blood (ACCU-CHEK GUIDE) test strip 1 each by Other route 4 (four) times daily. And lancets 4/day 360 each 3   Multiple Vitamin (MULTIVITAMIN ADULT PO) Take 1 tablet by mouth daily.     No current facility-administered medications on file prior to visit.    Allergies  Allergen Reactions   Fish Allergy Anaphylaxis   Shellfish Allergy Anaphylaxis    Family History  Problem Relation Age of Onset   Lung cancer Father    Diabetes Maternal Grandmother     BP 126/84 (BP Location: Right Arm, Patient Position: Sitting, Cuff Size: Large)   Pulse (!) 103   Ht 5\' 8"  (1.727 m)   Wt 199 lb 9.6 oz (90.5 kg)   SpO2 90%   BMI 30.35 kg/m    Review of Systems     Objective:   Physical Exam Pulses: dorsalis pedis intact bilat.   MSK: no deformity of the feet CV: no leg edema Skin:  no ulcer on the feet.  normal color and temp on the feet. Neuro: sensation is intact to touch on the feet    Lab Results  Component Value Date   HGBA1C 6.6 (A) 01/20/2021      Assessment & Plan:  Type 1 DM: overcontrolled.  Patient Instructions  check your blood sugar 4 times a day: before the 3 meals, and at bedtime.  also check if you have symptoms of your blood sugar being too high or too low.  please keep a record of the readings and bring it to your next appointment here (or you can bring the meter itself).  You can write it on any piece of paper.  please call us sooner if your blood sugar goes below 70, or if you have a lot of readings over 200.   I have sent a prescription to your pharmacy, to reduce the Lantus.   Please come back for a follow-up appointment in January.

## 2021-05-26 ENCOUNTER — Ambulatory Visit: Payer: 59 | Admitting: Endocrinology

## 2021-07-21 ENCOUNTER — Ambulatory Visit (HOSPITAL_COMMUNITY)
Admission: EM | Admit: 2021-07-21 | Discharge: 2021-07-21 | Disposition: A | Discharge status: Home / Self Care | Payer: 59 | Attending: Physician Assistant | Admitting: Physician Assistant

## 2021-07-21 ENCOUNTER — Encounter (HOSPITAL_COMMUNITY): Payer: Self-pay | Admitting: Emergency Medicine

## 2021-07-21 ENCOUNTER — Other Ambulatory Visit: Payer: Self-pay

## 2021-07-21 DIAGNOSIS — K047 Periapical abscess without sinus: Secondary | ICD-10-CM | POA: Diagnosis not present

## 2021-07-21 DIAGNOSIS — R220 Localized swelling, mass and lump, head: Secondary | ICD-10-CM | POA: Diagnosis not present

## 2021-07-21 MED ORDER — HYDROCODONE-ACETAMINOPHEN 5-325 MG PO TABS: 1.0000 | ORAL_TABLET | Freq: Every evening | ORAL | 0 refills | Status: DC | PRN

## 2021-07-21 MED ORDER — AMOXICILLIN-POT CLAVULANATE 875-125 MG PO TABS: 1.0000 | ORAL_TABLET | Freq: Two times a day (BID) | ORAL | 0 refills | Status: AC

## 2021-07-21 NOTE — ED Triage Notes (Signed)
Left facial swelling since Friday.  Denies a tooth issue.  Patient does have pain in left side of face ?

## 2021-07-21 NOTE — ED Provider Notes (Signed)
[facility]    CSN: [id number] Arrival date & time: 07/21/21  1956      History   Chief Complaint Chief Complaint  Patient presents with   Facial Swelling    HPI Justin Wilkinson is a 41 y.o. male.   Patient presents today with a 4-day history of left-sided facial swelling.  Reports associated pain which is rated 10 on a 0-10 pain scale, described as throbbing/aching, worse with palpation or mastication, no alleviating factors identified.  He denies any recent dental procedure.  He denies any additional symptoms including fever, nausea, vomiting.  Denies any dysphagia, throat swelling, shortness of breath, muffled voice.  Denies any recent antibiotics.  He has been taking ibuprofen without improvement of symptoms.  He does have type 1 diabetes and reports blood sugars have been slightly elevated.  He is unable to attend work as result of pain and is requesting excuse note today.   Past Medical History:  Diagnosis Date   Diabetes mellitus without complication (HCC)    Hyperlipidemia    Hypertension     Patient Active Problem List   Diagnosis Date Noted   DKA, type 1 (HCC) 07/27/2019   Dyslipidemia 04/03/2013   Type I diabetes mellitus with complication, uncontrolled 01/03/2013   Essential hypertension, benign 12/17/2012    History reviewed. No pertinent surgical history.     Home Medications    Prior to Admission medications   Medication Sig Start Date End Date Taking? Authorizing Provider  amoxicillin-clavulanate (AUGMENTIN) 875-125 MG tablet Take 1 tablet by mouth every 12 (twelve) hours for 10 days. 07/21/21 07/31/21 Yes Kaelyn Innocent, Noberto Retort, PA-C  HYDROcodone-acetaminophen (NORCO/VICODIN) 5-325 MG tablet Take 1 tablet by mouth at bedtime as needed for up to 3 doses. 07/21/21  Yes Oley Lahaie, Noberto Retort, PA-C  Alcohol Swabs PADS 1 Container by Does not apply route 2 (two) times daily. 01/03/13   Alison Murray, MD  atorvastatin (LIPITOR) 40 MG tablet Take 1 tablet (40 mg  total) by mouth daily. 07/28/19   Burnadette Pop, MD  Fingerstix Lancets MISC 1 Container by Does not apply route 2 (two) times daily. 07/28/19   Burnadette Pop, MD  glucose blood (ACCU-CHEK GUIDE) test strip 1 each by Other route 4 (four) times daily. And lancets 4/day 02/26/20   Romero Belling, MD  insulin glargine (LANTUS SOLOSTAR) 100 UNIT/ML Solostar Pen Inject 53 Units into the skin every morning. And pen needles 1/day 01/20/21   Romero Belling, MD  Multiple Vitamin (MULTIVITAMIN ADULT PO) Take 1 tablet by mouth daily.    [provider]    Family History Family History  Problem Relation Age of Onset   Lung cancer Father    Diabetes Maternal Grandmother     Social History Social History   Tobacco Use   Smoking status: Former    Packs/day: 0.50    Types: Cigarettes    Quit date: 12/15/2012    Years since quitting: 8.6   Smokeless tobacco: Never  Vaping Use   Vaping Use: Never used  Substance Use Topics   Alcohol use: No   Drug use: No     Allergies   Fish allergy and Shellfish allergy   Review of Systems Review of Systems  Constitutional:  Positive for activity change. Negative for appetite change, fatigue and fever.  HENT:  Positive for dental problem and facial swelling. Negative for congestion, sinus pressure, sneezing and sore throat.   Respiratory:  Negative for cough and shortness of breath.  Cardiovascular:  Negative for chest pain.  Gastrointestinal:  Negative for abdominal pain, diarrhea, nausea and vomiting.    Physical Exam Triage Vital Signs ED Triage Vitals  Enc Vitals Group     BP 07/21/21 2054 (!) 137/93     Pulse Rate 07/21/21 2054 87     Resp 07/21/21 2054 20     Temp 07/21/21 2054 98.1 F (36.7 C)     Temp Source 07/21/21 2054 Oral     SpO2 07/21/21 2054 97 %     Weight --      Height --      Head Circumference --      Peak Flow --      Pain Score 07/21/21 2050 10     Pain Loc --      Pain Edu? --      Excl. in GC? --    No  data found.  Updated Vital Signs BP (!) 137/93 (BP Location: Left Arm)   Pulse 87   Temp 98.1 F (36.7 C) (Oral)   Resp 20   SpO2 97%   Visual Acuity Right Eye Distance:   Left Eye Distance:   Bilateral Distance:    Right Eye Near:   Left Eye Near:    Bilateral Near:     Physical Exam Vitals reviewed.  Constitutional:      General: He is awake.     Appearance: Normal appearance. He is well-developed. He is not ill-appearing.     Comments: Very pleasant male appears stated age in no acute distress sitting comfortably in exam room  HENT:     Head: Normocephalic and atraumatic.      Right Ear: External ear normal.     Left Ear: External ear normal.     Nose: Nose normal.     Mouth/Throat:     Dentition: Abnormal dentition. Dental tenderness, gingival swelling and dental abscesses present.     Pharynx: Uvula midline. No oropharyngeal exudate, posterior oropharyngeal erythema or uvula swelling.     Comments: No evidence of Ludwig angina Cardiovascular:     Rate and Rhythm: Normal rate and regular rhythm.     Heart sounds: Normal heart sounds, S1 normal and S2 normal. No murmur heard. Pulmonary:     Effort: Pulmonary effort is normal. No accessory muscle usage or respiratory distress.     Breath sounds: Normal breath sounds. No stridor. No wheezing, rhonchi or rales.     Comments: Clear to auscultation bilaterally Abdominal:     General: Bowel sounds are normal.     Palpations: Abdomen is soft.     Tenderness: There is no abdominal tenderness.  Neurological:     Mental Status: He is alert.  Psychiatric:        Behavior: Behavior is cooperative.     UC Treatments / Results  Labs (all labs ordered are listed, but only abnormal results are displayed) Labs Reviewed - No data to display  EKG   Radiology No results found.  Procedures Procedures (including critical care time)  Medications Ordered in UC Medications - No data to display  Initial Impression /  Assessment and Plan / UC Course  I have reviewed the triage vital signs and the nursing notes.  Pertinent labs & imaging results that were available during my care of the patient were reviewed by me and considered in my medical decision making (see chart for details).     Patient is well-appearing, nontoxic, afebrile, nontachycardic.  Dental abscess noted  on exam.  He was started on Augmentin twice daily for 10 days.  Recommended he use over-the-counter ibuprofen for pain during the day.  He was provided 3 doses of hydrocodone for nocturnal pain relief with instruction not to drive or drink alcohol with taking this medication as drowsiness is a common side effect.  Review of [place] controlled substance database shows no inappropriate refills.  Recommended he follow-up with dentist and was given contact information for local provider.  Discussed that if he has any worsening symptoms including high fever, difficulty swallowing, shortness of breath, muffled voice, swelling of his throat he needs to go to the emergency room.  Strict return precautions given to which he expressed understanding.  Work excuse note provided.  Final Clinical Impressions(s) / UC Diagnoses   Final diagnoses:  Dental abscess  Facial swelling  Dental infection     Discharge Instructions      Take Augmentin twice daily for 10 days.  Use ibuprofen up to 800 mg 3 times a day for pain during the day.  You can take hydrocodone at night.  I have sent in 3 doses of this.  It would make you sleepy so do not drive or drink alcohol with taking it.  Follow-up with a dentist as soon as possible.  If you develop any worsening symptoms including difficulty speaking, difficulty swallowing, swelling of your throat, high fever, nausea/vomiting you need to be seen immediately.    ED Prescriptions     Medication Sig Dispense Auth. Provider   amoxicillin-clavulanate (AUGMENTIN) 875-125 MG tablet Take 1 tablet by mouth every 12  (twelve) hours for 10 days. 20 tablet Ely Spragg K, PA-C   HYDROcodone-acetaminophen (NORCO/VICODIN) 5-325 MG tablet Take 1 tablet by mouth at bedtime as needed for up to 3 doses. 3 tablet Arlett Goold K, PA-C      I have reviewed the PDMP during this encounter.   Jeani Hawking, PA-C 07/21/21 2108

## 2021-07-21 NOTE — Discharge Instructions (Addendum)
Take Augmentin twice daily for 10 days.  Use ibuprofen up to 800 mg 3 times a day for pain during the day.  You can take hydrocodone at night.  I have sent in 3 doses of this.  It would make you sleepy so do not drive or drink alcohol with taking it.  Follow-up with a dentist as soon as possible.  If you develop any worsening symptoms including difficulty speaking, difficulty swallowing, swelling of your throat, high fever, nausea/vomiting you need to be seen immediately. ?

## 2021-07-22 ENCOUNTER — Telehealth (HOSPITAL_COMMUNITY): Payer: Self-pay | Admitting: Physician Assistant

## 2021-07-22 MED ORDER — HYDROCODONE-ACETAMINOPHEN 5-325 MG PO TABS: 1.0000 | ORAL_TABLET | Freq: Every evening | ORAL | 0 refills | Status: DC | PRN

## 2021-07-22 NOTE — Telephone Encounter (Signed)
Received a phone message that [facility] was out of hydrocodone which was prescribed to patient yesterday.  Attempted to call patient to discuss alternative pharmacy but unable to reach him.  Voice message was left requesting return call. ?

## 2021-07-22 NOTE — Telephone Encounter (Signed)
Patient was prescribed 3 doses of hydrocodone last night due to severity of dental pain.  He was unable to get these from [facility] as originally ordered due to availability.  Hydrocodone and oxycodone are not available at [facility].  Contacted patient and changed prescription to [facility].  New prescription sent.  If he has any difficulty obtaining this medication he is to contact us. ?

## 2021-09-15 ENCOUNTER — Other Ambulatory Visit: Payer: Self-pay | Admitting: Endocrinology

## 2021-09-15 ENCOUNTER — Ambulatory Visit: Payer: Self-pay | Admitting: Endocrinology

## 2022-07-13 ENCOUNTER — Telehealth: Payer: Self-pay | Admitting: General Practice

## 2022-07-13 ENCOUNTER — Ambulatory Visit: Payer: Self-pay | Admitting: Family Medicine

## 2022-07-13 NOTE — Telephone Encounter (Signed)
Unable to reschedule at [facility] 

## 2022-07-13 NOTE — Telephone Encounter (Signed)
Pt was a no show for a NP app with Dr Grandville Silos on 07/13/22, I sent a no show letter.

## 2023-06-10 ENCOUNTER — Emergency Department (HOSPITAL_COMMUNITY)
Admission: EM | Admit: 2023-06-10 | Discharge: 2023-06-10 | Disposition: A | Discharge status: Home / Self Care | Payer: Self-pay | Attending: Emergency Medicine | Admitting: Emergency Medicine

## 2023-06-10 ENCOUNTER — Other Ambulatory Visit: Payer: Self-pay

## 2023-06-10 DIAGNOSIS — R52 Pain, unspecified: Secondary | ICD-10-CM

## 2023-06-10 DIAGNOSIS — M791 Myalgia, unspecified site: Secondary | ICD-10-CM | POA: Insufficient documentation

## 2023-06-10 DIAGNOSIS — R109 Unspecified abdominal pain: Secondary | ICD-10-CM | POA: Insufficient documentation

## 2023-06-10 DIAGNOSIS — E109 Type 1 diabetes mellitus without complications: Secondary | ICD-10-CM | POA: Insufficient documentation

## 2023-06-10 DIAGNOSIS — Z20822 Contact with and (suspected) exposure to covid-19: Secondary | ICD-10-CM | POA: Insufficient documentation

## 2023-06-10 LAB — COMPREHENSIVE METABOLIC PANEL
ALT: 9 U/L (ref 0–44)
AST: 11 U/L — ABNORMAL LOW (ref 15–41)
Albumin: 3.6 g/dL (ref 3.5–5.0)
Alkaline Phosphatase: 45 U/L (ref 38–126)
Anion gap: 9 (ref 5–15)
BUN: 14 mg/dL (ref 6–20)
CO2: 27 mmol/L (ref 22–32)
Calcium: 9.6 mg/dL (ref 8.9–10.3)
Chloride: 104 mmol/L (ref 98–111)
Creatinine, Ser: 0.96 mg/dL (ref 0.61–1.24)
GFR, Estimated: >60 mL/min (ref >60)
Glucose, Bld: 159 mg/dL — ABNORMAL HIGH (ref 70–99)
Potassium: 4.6 mmol/L (ref 3.5–5.1)
Sodium: 140 mmol/L (ref 135–145)
Total Bilirubin: 0.4 mg/dL (ref 0.0–1.2)
Total Protein: 6.8 g/dL (ref 6.5–8.1)

## 2023-06-10 LAB — CBC WITH DIFFERENTIAL/PLATELET
Abs Immature Granulocytes: 0.02 10*3/uL (ref 0.00–0.07)
Basophils Absolute: 0.1 10*3/uL (ref 0.0–0.1)
Basophils Relative: 1 %
Eosinophils Absolute: 0.1 10*3/uL (ref 0.0–0.5)
Eosinophils Relative: 1 %
HCT: 47.4 % (ref 39.0–52.0)
Hemoglobin: 16.4 g/dL (ref 13.0–17.0)
Immature Granulocytes: 0 %
Lymphocytes Relative: 40 %
Lymphs Abs: 3.1 10*3/uL (ref 0.7–4.0)
MCH: 31 pg (ref 26.0–34.0)
MCHC: 34.6 g/dL (ref 30.0–36.0)
MCV: 89.6 fL (ref 80.0–100.0)
Monocytes Absolute: 0.6 10*3/uL (ref 0.1–1.0)
Monocytes Relative: 8 %
Neutro Abs: 3.9 10*3/uL (ref 1.7–7.7)
Neutrophils Relative %: 50 %
Platelets: 341 10*3/uL (ref 150–400)
RBC: 5.29 MIL/uL (ref 4.22–5.81)
RDW: 12.5 % (ref 11.5–15.5)
WBC: 7.9 10*3/uL (ref 4.0–10.5)
nRBC: 0 % (ref 0.0–0.2)

## 2023-06-10 LAB — RESP PANEL BY RT-PCR (RSV, FLU A&B, COVID)  RVPGX2
Influenza A by PCR: NEGATIVE
Influenza B by PCR: NEGATIVE
Resp Syncytial Virus by PCR: NEGATIVE
SARS Coronavirus 2 by RT PCR: NEGATIVE

## 2023-06-10 LAB — CK: Total CK: 85 U/L (ref 49–397)

## 2023-06-10 MED ORDER — IBUPROFEN 400 MG PO TABS
600.0000 mg | ORAL_TABLET | Freq: Once | ORAL | Status: AC
  Administered 2023-06-10: 600 mg via ORAL
  Filled 2023-06-10: qty 1

## 2023-06-10 MED ORDER — ACETAMINOPHEN 500 MG PO TABS
1000.0000 mg | ORAL_TABLET | Freq: Once | ORAL | Status: AC
  Administered 2023-06-10: 1000 mg via ORAL
  Filled 2023-06-10: qty 2

## 2023-06-10 NOTE — ED Notes (Signed)
 Called lab, they are adding the CK to the previously sent specimen.

## 2023-06-10 NOTE — Discharge Instructions (Signed)
 You were seen in the emergency department for your body aches.  Your workup today showed that you have normal electrolytes, no evidence of muscle breakdown you are negative for COVID and flu.  It is unclear what is causing your symptoms at this time but you can follow-up with your primary doctor to have additional workup.  You can take Tylenol  and Motrin  as needed for pain and continue to do warm bath soaks.  You should return to the emergency department if you are having fevers, joint swelling or any other new or concerning symptoms.

## 2023-06-10 NOTE — ED Triage Notes (Signed)
 Pt. Stated, I've had bodyaches for 2 months to the point it keeps me from sleeping

## 2023-06-10 NOTE — ED Provider Notes (Signed)
 [place] EMERGENCY DEPARTMENT AT [facility] Provider Note   CSN: [id number] Arrival date & time: 06/10/23  9081     History  Chief Complaint  Patient presents with   bodyaches    Justin Wilkinson is a 43 y.o. male.  Patient is a 44 year old male with a past medical history of type 1 diabetes presenting to the emergency department with bodyaches.  The patient states that he has had diffuse bodyaches from head to toe for the last 2 months.  He states that he is sore all over after work and has been soaking in the tub and its only thing that seems to help.  He states that he gets some tingling in his fingertips and toes occasionally but otherwise denies any numbness or weakness.  He denies any joint pains or swelling.  He denies any fever, cough or congestion.  He states that last night he had some associated abdominal pain which prompted him to come to the emergency department today.  He denies any vomiting or diarrhea.  States he has not taken any medication at home.  The history is provided by the patient.       Home Medications Prior to Admission medications   Medication Sig Start Date End Date Taking? Authorizing Provider  Alcohol  Swabs  PADS 1 Container by Does not apply route 2 (two) times daily. 01/03/13   Levern Beckey HERO, MD  atorvastatin  (LIPITOR) 40 MG tablet Take 1 tablet (40 mg total) by mouth daily. 07/28/19   Jillian Buttery, MD  Fingerstix Lancets MISC 1 Container by Does not apply route 2 (two) times daily. 07/28/19   Jillian Buttery, MD  glucose blood (ACCU-CHEK GUIDE) test strip 1 each by Other route 4 (four) times daily. And lancets 4/day 02/26/20   Kassie Mallick, MD  HYDROcodone -acetaminophen  (NORCO/VICODIN) 5-325 MG tablet Take 1 tablet by mouth at bedtime as needed for up to 3 doses. 07/22/21   Raspet, Erin K, PA-C  LANTUS  SOLOSTAR 100 UNIT/ML Solostar Pen INJECT 55 UNITS INTO THE SKIN EVERY MORNING 09/16/21   Trixie File, MD  Multiple Vitamin (MULTIVITAMIN  ADULT PO) Take 1 tablet by mouth daily.    [provider]      Allergies    Fish allergy and Shellfish allergy    Review of Systems   Review of Systems  Physical Exam Updated Vital Signs BP (!) 138/96 (BP Location: Right Arm)   Pulse 83   Temp 98.3 F (36.8 C) (Oral)   Resp 18   Ht 5' 8 (1.727 m)   Wt 72.6 kg   SpO2 99%   BMI 24.33 kg/m  Physical Exam Vitals and nursing note reviewed.  Constitutional:      General: He is not in acute distress.    Appearance: Normal appearance.  HENT:     Head: Normocephalic and atraumatic.     Nose: Nose normal.     Mouth/Throat:     Mouth: Mucous membranes are moist.     Pharynx: Oropharynx is clear.  Eyes:     Extraocular Movements: Extraocular movements intact.     Conjunctiva/sclera: Conjunctivae normal.  Cardiovascular:     Rate and Rhythm: Normal rate and regular rhythm.     Heart sounds: Normal heart sounds.  Pulmonary:     Effort: Pulmonary effort is normal.     Breath sounds: Normal breath sounds.  Abdominal:     General: Abdomen is flat.     Palpations: Abdomen is soft.  Tenderness: There is no abdominal tenderness.  Musculoskeletal:        General: Tenderness (diffuse muscle tenderness to palpation) present. No swelling. Normal range of motion.     Cervical back: Normal range of motion and neck supple.  Skin:    General: Skin is warm and dry.  Neurological:     General: No focal deficit present.     Mental Status: He is alert and oriented to person, place, and time.     Sensory: No sensory deficit.     Motor: No weakness.  Psychiatric:        Mood and Affect: Mood normal.        Behavior: Behavior normal.     ED Results / Procedures / Treatments   Labs (all labs ordered are listed, but only abnormal results are displayed) Labs Reviewed  COMPREHENSIVE METABOLIC PANEL - Abnormal; Notable for the following components:      Result Value   Glucose, Bld 159 (*)    AST 11 (*)    All other  components within normal limits  RESP PANEL BY RT-PCR (RSV, FLU A&B, COVID)  RVPGX2  CBC WITH DIFFERENTIAL/PLATELET  CK    EKG None  Radiology No results found.  Procedures Procedures    Medications Ordered in ED Medications  acetaminophen  (TYLENOL ) tablet 1,000 mg (1,000 mg Oral Given 06/10/23 1409)  ibuprofen  (ADVIL ) tablet 600 mg (600 mg Oral Given 06/10/23 1409)    ED Course/ Medical Decision Making/ A&P                                 Medical Decision Making This patient presents to the ED with chief complaint(s) of body aches with pertinent past medical history of DM which further complicates the presenting complaint. The complaint involves an extensive differential diagnosis and also carries with it a high risk of complications and morbidity.    The differential diagnosis includes viral syndrome, rhabdo, no joint pains or swelling making polyarthritis less likely, dehydration, electrolyte abnormality  Additional history obtained: Additional history obtained from N/A Records reviewed outpatient endocrine records  ED Course and Reassessment: On patient's arrival he is hemodynamically stable in no acute distress.  He had labs and viral swab performed in triage.  CBC viral swab is negative.  CMP is pending.  Will additionally add on CK.  He was given Tylenol  and Motrin  for pain control and will be closely reassessed.  Independent labs interpretation:  The following labs were independently interpreted: within normal range  Independent visualization of imaging: - N/A  Consultation: - Consulted or discussed management/test interpretation w/ external professional: N/A  Consideration for admission or further workup: Patient has no emergent conditions requiring admission or further work-up at this time and is stable for discharge home with primary care follow-up  Social Determinants of health: N/A    Amount and/or Complexity of Data Reviewed Labs: ordered.  Risk OTC  drugs.          Final Clinical Impression(s) / ED Diagnoses Final diagnoses:  Generalized body aches    Rx / DC Orders ED Discharge Orders     None         Kingsley, Anastasio Wogan K, DO 06/10/23 1524

## 2023-06-14 ENCOUNTER — Emergency Department (HOSPITAL_COMMUNITY)
Admission: EM | Admit: 2023-06-14 | Discharge: 2023-06-14 | Disposition: A | Discharge status: Home / Self Care | Payer: Self-pay | Attending: Emergency Medicine | Admitting: Emergency Medicine

## 2023-06-14 ENCOUNTER — Encounter (HOSPITAL_COMMUNITY): Payer: Self-pay

## 2023-06-14 ENCOUNTER — Other Ambulatory Visit: Payer: Self-pay

## 2023-06-14 DIAGNOSIS — M791 Myalgia, unspecified site: Secondary | ICD-10-CM | POA: Insufficient documentation

## 2023-06-14 DIAGNOSIS — R200 Anesthesia of skin: Secondary | ICD-10-CM | POA: Insufficient documentation

## 2023-06-14 DIAGNOSIS — Z794 Long term (current) use of insulin: Secondary | ICD-10-CM | POA: Insufficient documentation

## 2023-06-14 DIAGNOSIS — I10 Essential (primary) hypertension: Secondary | ICD-10-CM | POA: Insufficient documentation

## 2023-06-14 DIAGNOSIS — E119 Type 2 diabetes mellitus without complications: Secondary | ICD-10-CM | POA: Insufficient documentation

## 2023-06-14 MED ORDER — METHOCARBAMOL 500 MG PO TABS: 500.0000 mg | ORAL_TABLET | Freq: Two times a day (BID) | ORAL | 0 refills | Status: DC

## 2023-06-14 NOTE — Discharge Instructions (Signed)
 You were evaluated in the emergency room for muscle aches and hand numbness.  Your exam was consistent with bilateral carpal tunnel.  As discussed you can wear night splints or use a large pillow to prevent curling of your wrist.  A prescription for Robaxin , muscle relaxer was sent to your pharmacy for full body muscle aches.  Please avoid driving or operating heavy machinery while using this medication as it can cause drowsiness.  You were provided a referral to be evaluated by orthopedics.  You have additionally provided resources to establish with a primary care doctor for further workup.  If you experience any new or worsening symptoms please return to the emergency room.

## 2023-06-14 NOTE — ED Triage Notes (Addendum)
 Body aches for 2 months. C/o nubmness in tingling in both hands and feet for a few weeks. Pt is diabetic. Pt had respiratory swab 2/6 that was negative

## 2023-06-14 NOTE — ED Provider Notes (Signed)
[facility] AT [facility] Provider Note   CSN: [id number] Arrival date & time: 06/14/23  1606     History  Chief Complaint  Patient presents with   Generalized Body Aches    Justin Wilkinson is a 43 y.o. male presents with complaints of bodyaches and hand numbness has been ongoing for the past 2 months.  Patient was recently evaluated on 2/6 at [facility], ED for similar complaints.  Workup was unremarkable.  Reports persistent symptoms.  Describes hand numbness predominately involving his thumb index and ring finger worse at work.  He predominately drives forklift at work.  No chest pain, shortness of breath, vomiting or abdominal pain.  HPI    Past Medical History:  Diagnosis Date   Diabetes mellitus without complication (HCC)    Hyperlipidemia    Hypertension     Home Medications Prior to Admission medications   Medication Sig Start Date End Date Taking? Authorizing Provider  methocarbamol (ROBAXIN) 500 MG tablet Take 1 tablet (500 mg total) by mouth 2 (two) times daily. 06/14/23  Yes Halford Decamp, PA-C  Alcohol Swabs PADS 1 Container by Does not apply route 2 (two) times daily. 01/03/13   Alison Murray, MD  atorvastatin (LIPITOR) 40 MG tablet Take 1 tablet (40 mg total) by mouth daily. 07/28/19   Burnadette Pop, MD  Fingerstix Lancets MISC 1 Container by Does not apply route 2 (two) times daily. 07/28/19   Burnadette Pop, MD  glucose blood (ACCU-CHEK GUIDE) test strip 1 each by Other route 4 (four) times daily. And lancets 4/day 02/26/20   Romero Belling, MD  HYDROcodone-acetaminophen (NORCO/VICODIN) 5-325 MG tablet Take 1 tablet by mouth at bedtime as needed for up to 3 doses. 07/22/21   Raspet, Noberto Retort, PA-C  LANTUS SOLOSTAR 100 UNIT/ML Solostar Pen INJECT 55 UNITS INTO THE SKIN EVERY MORNING 09/16/21   Carlus Pavlov, MD  Multiple Vitamin (MULTIVITAMIN ADULT PO) Take 1 tablet by mouth daily.    [provider]      Allergies     Fish allergy and Shellfish allergy    Review of Systems   Review of Systems  Musculoskeletal:  Positive for myalgias.    Physical Exam Updated Vital Signs BP (!) 130/92   Pulse 95   Temp 98 F (36.7 C) (Oral)   Resp 18   Ht 5\' 8"  (1.727 m)   Wt 72.7 kg   SpO2 100%   BMI 24.36 kg/m  Physical Exam Vitals and nursing note reviewed.  Constitutional:      General: He is not in acute distress.    Appearance: He is well-developed.  HENT:     Head: Normocephalic and atraumatic.  Eyes:     Conjunctiva/sclera: Conjunctivae normal.  Cardiovascular:     Rate and Rhythm: Normal rate and regular rhythm.     Heart sounds: No murmur heard. Pulmonary:     Effort: Pulmonary effort is normal. No respiratory distress.     Breath sounds: Normal breath sounds.  Abdominal:     Palpations: Abdomen is soft.     Tenderness: There is no abdominal tenderness.  Musculoskeletal:        General: No swelling.     Cervical back: Neck supple.     Comments: Positive Tinel's bilateral carpal tunnels, 5 out of 5 grip strength, full range of motion bilateral upper and lower extremities, ambulates without difficulty, no erythema or warmth over joints  Skin:    General:  Skin is warm and dry.     Capillary Refill: Capillary refill takes less than 2 seconds.  Neurological:     Mental Status: He is alert.  Psychiatric:        Mood and Affect: Mood normal.     ED Results / Procedures / Treatments   Labs (all labs ordered are listed, but only abnormal results are displayed) Labs Reviewed - No data to display  EKG None  Radiology No results found.  Procedures Procedures    Medications Ordered in ED Medications - No data to display  ED Course/ Medical Decision Making/ A&P                                 Medical Decision Making Risk Prescription drug management.   This patient presents to the ED with chief complaint(s) of myalgias and hand numbness.  The complaint involves an  extensive differential diagnosis and also carries with it a high risk of complications and morbidity.   pertinent past medical history as listed in HPI  The differential diagnosis includes  underlying fracture, epidural hematoma/abscess, cauda equina syndrome, malignancy, discitis, spinal infection, septic joint, gout, autoimmune, rheumatology   Additional history obtained:  Records reviewed previous admission documents and Care Everywhere/External Records  Initial Assessment:   Hemodynamically stable patient presenting with full body myalgias and hand numbness on exam patient had full range of motion of all extremities with 5 out of 5 strength.  No specific joint with erythema or warmth.  Overall no suspicion for septic joint or gout.  He does have bilateral positive Tinel's bilateral carpal tunnels suggestive of carpal tunnel.  We reviewed conservative measures for this including nocturnal splinting.  His employment requires driving forklifts with constant vibration.  No neurological deficits and normal neuro exam.  Patient can ambulate without difficulty.  No loss of bowel or bladder control.  No concern for cauda equina.  No fever, night sweats, weight loss, h/o cancer, IVDU.  Overall most consistent with musculoskeletal etiology.  Independent ECG interpretation:  none  Independent labs interpretation:  The following labs were independently interpreted:  none  Independent visualization and interpretation of imaging: none  Treatment and Reassessment: none  Consultations obtained:   none  Disposition:   Patient will be discharged home with short supply of muscle relaxants.  The patient has been appropriately medically screened and/or stabilized in the ED. I have low suspicion for any other emergent medical condition which would require further screening, evaluation or treatment in the ED or require inpatient management. At time of discharge the patient is hemodynamically stable and in  no acute distress. I have discussed work-up results and diagnosis with patient and answered all questions. Patient is agreeable with discharge plan. We discussed strict return precautions for returning to the emergency department and they verbalized understanding.     Social Determinants of Health:   Patient's impaired access to primary care  increases the complexity of managing their presentation  This note was dictated with voice recognition software.  Despite best efforts at proofreading, errors may have occurred which can change the documentation meaning.          Final Clinical Impression(s) / ED Diagnoses Final diagnoses:  Myalgia  Bilateral hand numbness    Rx / DC Orders ED Discharge Orders          Ordered    methocarbamol (ROBAXIN) 500 MG tablet  2 times daily  06/14/23 2140              Halford Decamp, PA-C 06/14/23 2228    Bethann Berkshire, MD 06/16/23 [phone]

## 2023-07-07 ENCOUNTER — Encounter (HOSPITAL_COMMUNITY): Payer: Self-pay | Admitting: Physician Assistant

## 2023-07-07 ENCOUNTER — Encounter (HOSPITAL_COMMUNITY): Payer: Self-pay

## 2023-07-07 ENCOUNTER — Ambulatory Visit (HOSPITAL_COMMUNITY)
Admission: EM | Admit: 2023-07-07 | Discharge: 2023-07-07 | Disposition: A | Discharge status: Home / Self Care | Payer: Self-pay | Attending: Physician Assistant | Admitting: Physician Assistant

## 2023-07-07 DIAGNOSIS — M791 Myalgia, unspecified site: Secondary | ICD-10-CM | POA: Insufficient documentation

## 2023-07-07 DIAGNOSIS — R208 Other disturbances of skin sensation: Secondary | ICD-10-CM | POA: Insufficient documentation

## 2023-07-07 DIAGNOSIS — M2550 Pain in unspecified joint: Secondary | ICD-10-CM | POA: Insufficient documentation

## 2023-07-07 LAB — CBC WITH DIFFERENTIAL/PLATELET
Abs Immature Granulocytes: 0.02 10*3/uL (ref 0.00–0.07)
Basophils Absolute: 0 10*3/uL (ref 0.0–0.1)
Basophils Relative: 0 %
Eosinophils Absolute: 0.1 10*3/uL (ref 0.0–0.5)
Eosinophils Relative: 1 %
HCT: 46.6 % (ref 39.0–52.0)
Hemoglobin: 16.1 g/dL (ref 13.0–17.0)
Immature Granulocytes: 0 %
Lymphocytes Relative: 35 %
Lymphs Abs: 2.4 10*3/uL (ref 0.7–4.0)
MCH: 30.5 pg (ref 26.0–34.0)
MCHC: 34.5 g/dL (ref 30.0–36.0)
MCV: 88.3 fL (ref 80.0–100.0)
Monocytes Absolute: 0.6 10*3/uL (ref 0.1–1.0)
Monocytes Relative: 8 %
Neutro Abs: 3.8 10*3/uL (ref 1.7–7.7)
Neutrophils Relative %: 56 %
Platelets: 387 10*3/uL (ref 150–400)
RBC: 5.28 MIL/uL (ref 4.22–5.81)
RDW: 12.7 % (ref 11.5–15.5)
WBC: 6.9 10*3/uL (ref 4.0–10.5)
nRBC: 0 % (ref 0.0–0.2)

## 2023-07-07 LAB — COMPREHENSIVE METABOLIC PANEL
ALT: 9 U/L (ref 0–44)
AST: 15 U/L (ref 15–41)
Albumin: 3.6 g/dL (ref 3.5–5.0)
Alkaline Phosphatase: 38 U/L (ref 38–126)
Anion gap: 10 (ref 5–15)
BUN: 10 mg/dL (ref 6–20)
CO2: 24 mmol/L (ref 22–32)
Calcium: 9.1 mg/dL (ref 8.9–10.3)
Chloride: 103 mmol/L (ref 98–111)
Creatinine, Ser: 0.89 mg/dL (ref 0.61–1.24)
GFR, Estimated: >60 mL/min (ref >60)
Glucose, Bld: 341 mg/dL — ABNORMAL HIGH (ref 70–99)
Potassium: 4.5 mmol/L (ref 3.5–5.1)
Sodium: 137 mmol/L (ref 135–145)
Total Bilirubin: 0.2 mg/dL (ref 0.0–1.2)
Total Protein: 6.5 g/dL (ref 6.5–8.1)

## 2023-07-07 LAB — SEDIMENTATION RATE: Sed Rate: 1 mm/h (ref 0–16)

## 2023-07-07 MED ORDER — BACLOFEN 10 MG PO TABS: 10.0000 mg | ORAL_TABLET | Freq: Three times a day (TID) | ORAL | 0 refills | Status: DC

## 2023-07-07 NOTE — ED Provider Notes (Signed)
 [facility]    CSN: [id number] Arrival date & time: 07/07/23  1451      History   Chief Complaint Chief Complaint  Patient presents with   Generalized Body Aches    HPI Justin Wilkinson is a 43 y.o. male.   Patient presents today with widespread arthralgias and myalgias since November 2024.  He has been seen in the emergency room twice with similar symptoms at which point he had negative viral workup as well as normal metabolic panel, CBC, CK.  He denies any change in activity or known injury before symptoms began.  He reports pain involves his entire body and even putting on close this painful.  He has tried Tylenol, ibuprofen, previously prescribed Robaxin without improvement of symptoms.  He denies any known tick bites.  He does have a history of type 1 diabetes but reports that his blood sugars are adequately controlled.  He denies any family history of rheumatological condition including RA.  He does report that pain is worse in the morning but is generally present throughout the day without significant improvement.  He has been using a heating blanket and warm baths to help manage his symptoms temporarily.  It is interfering with his ability to sleep at night.    Past Medical History:  Diagnosis Date   Diabetes mellitus without complication (HCC)    Hyperlipidemia    Hypertension     Patient Active Problem List   Diagnosis Date Noted   DKA, type 1 (HCC) 07/27/2019   Dyslipidemia 04/03/2013   Type I diabetes mellitus with complication, uncontrolled 01/03/2013   Essential hypertension, benign 12/17/2012    History reviewed. No pertinent surgical history.     Home Medications    Prior to Admission medications   Medication Sig Start Date End Date Taking? Authorizing Provider  baclofen (LIORESAL) 10 MG tablet Take 1 tablet (10 mg total) by mouth 3 (three) times daily. 07/07/23  Yes Deerica Waszak K, PA-C  LANTUS SOLOSTAR 100 UNIT/ML Solostar Pen INJECT 55 UNITS  INTO THE SKIN EVERY MORNING 09/16/21  Yes Carlus Pavlov, MD  Alcohol Swabs PADS 1 Container by Does not apply route 2 (two) times daily. 01/03/13   Alison Murray, MD  atorvastatin (LIPITOR) 40 MG tablet Take 1 tablet (40 mg total) by mouth daily. 07/28/19   Burnadette Pop, MD  Fingerstix Lancets MISC 1 Container by Does not apply route 2 (two) times daily. 07/28/19   Burnadette Pop, MD  glucose blood (ACCU-CHEK GUIDE) test strip 1 each by Other route 4 (four) times daily. And lancets 4/day 02/26/20   Romero Belling, MD    Family History Family History  Problem Relation Age of Onset   Lung cancer Father    Diabetes Maternal Grandmother     Social History Social History   Tobacco Use   Smoking status: Former    Current packs/day: 0.00    Types: Cigarettes    Quit date: 12/15/2012    Years since quitting: 10.5   Smokeless tobacco: Never  Vaping Use   Vaping status: Never Used  Substance Use Topics   Alcohol use: No   Drug use: No     Allergies   Fish allergy and Shellfish allergy   Review of Systems Review of Systems  Constitutional:  Positive for activity change. Negative for appetite change, fatigue and fever.  HENT:  Negative for congestion, sinus pressure, sneezing and sore throat.   Respiratory:  Negative for cough and shortness of breath.  Cardiovascular:  Negative for chest pain.  Gastrointestinal:  Negative for abdominal pain, diarrhea, nausea and vomiting.  Musculoskeletal:  Positive for arthralgias and myalgias.  Neurological:  Positive for numbness. Negative for dizziness, weakness, light-headedness and headaches.     Physical Exam Triage Vital Signs ED Triage Vitals [07/07/23 1635]  Encounter Vitals Group     BP 121/87     Systolic BP Percentile      Diastolic BP Percentile      Pulse Rate 98     Resp 16     Temp 97.8 F (36.6 C)     Temp Source Oral     SpO2 98 %     Weight      Height      Head Circumference      Peak Flow      Pain Score       Pain Loc      Pain Education      Exclude from Growth Chart    No data found.  Updated Vital Signs BP 121/87 (BP Location: Left Arm)   Pulse 98   Temp 97.8 F (36.6 C) (Oral)   Resp 16   SpO2 98%   Visual Acuity Right Eye Distance:   Left Eye Distance:   Bilateral Distance:    Right Eye Near:   Left Eye Near:    Bilateral Near:     Physical Exam Vitals reviewed.  Constitutional:      General: He is awake.     Appearance: Normal appearance. He is well-developed. He is not ill-appearing.     Comments: Very pleasant male appears stated age in no acute distress sitting comfortably in exam room  HENT:     Head: Normocephalic and atraumatic.     Mouth/Throat:     Pharynx: Uvula midline. No oropharyngeal exudate or posterior oropharyngeal erythema.  Cardiovascular:     Rate and Rhythm: Normal rate and regular rhythm.     Heart sounds: Normal heart sounds, S1 normal and S2 normal. No murmur heard. Pulmonary:     Effort: Pulmonary effort is normal.     Breath sounds: Normal breath sounds. No stridor. No wheezing, rhonchi or rales.     Comments: Clear to auscultation bilaterally Abdominal:     General: Bowel sounds are normal.     Palpations: Abdomen is soft.     Tenderness: There is no abdominal tenderness. There is no right CVA tenderness, left CVA tenderness, guarding or rebound.  Musculoskeletal:     Comments: Significant pain with even mild palpation throughout major muscle groups.  Normal active range of motion in all major joints.  No deformity or focal tenderness noted.  Normal pincer grip strength.  Strength 5/5 bilateral upper and lower extremities.  Neurological:     Mental Status: He is alert.  Psychiatric:        Behavior: Behavior is cooperative.      UC Treatments / Results  Labs (all labs ordered are listed, but only abnormal results are displayed) Labs Reviewed  CBC WITH DIFFERENTIAL/PLATELET  COMPREHENSIVE METABOLIC PANEL  SEDIMENTATION RATE   ANA W/REFLEX IF POSITIVE  RHEUMATOID FACTOR  CYCLIC CITRUL PEPTIDE ANTIBODY, IGG/IGA  LYME DISEASE SEROLOGY W/REFLEX    EKG   Radiology No results found.  Procedures Procedures (including critical care time)  Medications Ordered in UC Medications - No data to display  Initial Impression / Assessment and Plan / UC Course  I have reviewed the triage vital signs and the  nursing notes.  Pertinent labs & imaging results that were available during my care of the patient were reviewed by me and considered in my medical decision making (see chart for details).     Patient is well-appearing, afebrile, nontoxic, nontachycardic.  Plain films were deferred as he had no focal tenderness and had widespread pain.  Concern for fibromyalgia or other pain syndrome.  Will obtain more extensive workup of rheumatological etiology including RF, anti-CCP, ESR, CBC, CMP, Lyme disease, ANA and contact him if any of this is abnormal.  He does not currently have a primary care so we will try to establish him with 1 via PCP assistance.  Recommended that he use over-the-counter analgesic for pain relief and was also given baclofen.  We discussed that this can be sedating and he is not to drive or drink alcohol taking it.  Discussed that if he develops any additional symptoms he needs to return for reevaluation including weight loss, rash, nausea, vomiting, weakness.  Strict return precautions given.  Final Clinical Impressions(s) / UC Diagnoses   Final diagnoses:  Allodynia  Myalgia  Polyarthralgia     Discharge Instructions      We will contact you if any of your blood work is abnormal.  Make sure that you are drinking plenty of fluid.  Take baclofen up to 3 times a day.  This will make you sleepy so do not drive or drink alcohol taking it.  We are going to establish care with a primary care; someone should call you to schedule an appointment.  If anything worsens or changes and develop fever, weight  loss, rash, nausea/vomiting, additional pain you need to return for reevaluation.     ED Prescriptions     Medication Sig Dispense Auth. Provider   baclofen (LIORESAL) 10 MG tablet Take 1 tablet (10 mg total) by mouth 3 (three) times daily. 30 each Ritter Helsley, Noberto Retort, PA-C      PDMP not reviewed this encounter.   Jeani Hawking, PA-C 07/07/23 1818

## 2023-07-07 NOTE — ED Triage Notes (Signed)
 Patient reports body aches since Nov 2024.No other symptoms.

## 2023-07-07 NOTE — Discharge Instructions (Signed)
 We will contact you if any of your blood work is abnormal.  Make sure that you are drinking plenty of fluid.  Take baclofen up to 3 times a day.  This will make you sleepy so do not drive or drink alcohol taking it.  We are going to establish care with a primary care; someone should call you to schedule an appointment.  If anything worsens or changes and develop fever, weight loss, rash, nausea/vomiting, additional pain you need to return for reevaluation.

## 2023-07-08 LAB — ANA W/REFLEX IF POSITIVE: Anti Nuclear Antibody (ANA): NEGATIVE

## 2023-07-08 LAB — LYME DISEASE SEROLOGY W/REFLEX: Lyme Total Antibody EIA: NEGATIVE

## 2023-07-08 LAB — CYCLIC CITRUL PEPTIDE ANTIBODY, IGG/IGA: CCP Antibodies IgG/IgA: 5 U (ref 0–19)

## 2023-07-09 LAB — RHEUMATOID FACTOR: Rheumatoid fact SerPl-aCnc: <10 [IU]/mL (ref <14.0)

## 2023-07-29 ENCOUNTER — Encounter: Payer: Self-pay | Admitting: Neurology

## 2023-09-21 NOTE — Progress Notes (Unsigned)
 Initial neurology clinic note  Reason for Evaluation: Consultation requested by Dickey Fought, PA for an opinion regarding muscle aches. My final recommendations will be communicated back to the requesting physician by way of shared medical record or letter to requesting physician via [place]  mail.  HPI: This is [name], a 43 y.o. right-handed male with a medical history of DM1, HLD, current smoker who presents to neurology clinic with the chief complaint of muscle aches. The patient is alone today.  Patient's symptoms started in 03/2023. He got off work and felt his body was achy. He thought this was related to working and pains related. He was taking nightly baths. It felt like muscle chills. He also gets pinching sensations throughout his body (face all the way down). Pain was initially 10/10 and constant. Only sitting in hot water would help symptoms. Cold, rainy weather seems to make symptoms worse.  He denies significant weakness. He denies autonomic symptoms or changes in bowel or bladder. He denies any episodes of myotonia (unable to relax muscles). He denies neck or back pain.  He has been to ED and urgent care multiple times without clear etiology identified.  He saw his PCP who started gabapentin 300 mg twice daily and Cymbalta 60 mg daily about 1 month ago. The pain has improved some with this and the medication has made him more mobile/able to do things. He now rates the pain 6-7/10. He was on baclofen  by urgent care, but this did not help.  Patient is prescribed lipitor for HLD but has not been taking it for years.   He has not had similar symptoms in the past. He mentions that the last year has been very stressful in terms of family and he has not been taking care of himself. He admits to not taking his lantus  well. He started back taking lantus  this year (maybe 06/2023).  The patient has not noticed any recent skin rashes nor does he report any fever or night sweats. He  has lost 30 lbs over 2 years. He thinks this is due to changing eating habits and maybe not eating as well as prior.  He is a current cigar smoker, 1-2 per day.  EtOH use: 2 beers on the weekend  Restrictive diet? no Family history of neurologic disease? no   MEDICATIONS:  Outpatient Encounter Medications as of 09/29/2023  Medication Sig   Alcohol  Swabs  PADS 1 Container by Does not apply route 2 (two) times daily.   atorvastatin  (LIPITOR) 40 MG tablet Take 1 tablet (40 mg total) by mouth daily.   baclofen  (LIORESAL ) 10 MG tablet Take 1 tablet (10 mg total) by mouth 3 (three) times daily.   Fingerstix Lancets MISC 1 Container by Does not apply route 2 (two) times daily.   glucose blood (ACCU-CHEK GUIDE) test strip 1 each by Other route 4 (four) times daily. And lancets 4/day   LANTUS  SOLOSTAR 100 UNIT/ML Solostar Pen INJECT 55 UNITS INTO THE SKIN EVERY MORNING (Patient taking differently: 40 Units daily.)   No facility-administered encounter medications on file as of 09/29/2023.    PAST MEDICAL HISTORY: Past Medical History:  Diagnosis Date   Diabetes mellitus without complication (HCC)    Hyperlipidemia    Hypertension     PAST SURGICAL HISTORY: History reviewed. No pertinent surgical history.  ALLERGIES: Allergies  Allergen Reactions   Fish Allergy Anaphylaxis   Shellfish Allergy Anaphylaxis    FAMILY HISTORY: Family History  Problem Relation Age of Onset  Lung cancer Father    Diabetes Maternal Grandmother     SOCIAL HISTORY: Social History   Tobacco Use   Smoking status: Former    Current packs/day: 0.00    Types: Cigarettes, Cigars    Quit date: 12/15/2012    Years since quitting: 10.7   Smokeless tobacco: Never   Tobacco comments:    1 or 2 a day cigars  Vaping Use   Vaping status: Never Used  Substance Use Topics   Alcohol  use: No   Drug use: No   Social History   Social History Narrative   Are you right handed or left handed? Right   Are you  currently employed ?    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 yes   Who lives with you?    What type of home do you live in: 1 story or 2 story? one    Caffiene occ     OBJECTIVE: PHYSICAL EXAM: BP (!) 126/92   Pulse 80   Ht 5\' 8"  (1.727 m)   Wt 162 lb (73.5 kg)   SpO2 98%   BMI 24.63 kg/m   General: General appearance: Awake and alert. No distress. Cooperative with exam.  Skin: No obvious rash or jaundice. HEENT: Atraumatic. Anicteric. Lungs: Non-labored breathing on room air  Heart: Regular Abdomen: Soft, non tender. Extremities: No edema. No obvious deformity.  Psych: Affect appropriate.  Neurological: Mental Status: Alert. Speech fluent. No pseudobulbar affect Cranial Nerves: CNII: No RAPD. Visual fields grossly intact. CNIII, IV, VI: PERRL. No nystagmus. EOMI. CN V: Facial sensation intact bilaterally to fine touch. CN VII: Facial muscles symmetric and strong. No ptosis at rest or after sustained upgaze. CN VIII: Hearing grossly intact bilaterally. CN IX: No hypophonia. CN X: Palate elevates symmetrically. CN XI: Full strength shoulder shrug bilaterally. CN XII: Tongue protrusion full and midline. No atrophy or fasciculations. No significant dysarthria Motor: Tone is normal. No atrophy. No grip or percussive myotonia. Strength 5/5 in bilateral upper and lower extremities Reflexes:  Right Left   Bicep 2+ 2+   Tricep 2+ 2+   BrRad 2+ 2+   Knee 2+ 2+   Ankle 1+ 1+    Pathological Reflexes: Babinski: flexor response bilaterally Hoffman: absent bilaterally Troemner: absent bilaterally Sensation: Pinprick: Intact in all extremities Vibration: Intact in all extremities Proprioception: Intact in bilateral great toes Coordination: Intact finger-to- nose-finger bilaterally. Romberg negative. Gait: Able to rise from chair with arms crossed unassisted. Normal, narrow-based gait. Able to tandem walk. Able to walk on  toes and heels.  Lab and Test Review: Internal labs: 07/07/23: Lyme negative CCP negative RF negative ANA negative ESR wnl  CK (06/10/23): 85  HbA1c (01/20/21): 6.6 HbA1c (02/26/20): 13.6  External labs: 07/19/23: CMP significant for glucose 335 CBC unremarkable  ASSESSMENT: Justin Wilkinson is a 43 y.o. male who presents for evaluation of diffuse body/muscle aches. He has a relevant medical history of DM1, HLD, and current smoker. His neurological examination is essentially normal today. Available diagnostic data is significant for normal CK, negative lyme, CCP, RF, ANA, and normal ESR. The etiology of patient's symptoms is currently unclear. He was not taking any medications for a while, including insulin  or lipitor (which he is still not taking). His untreated DM could be at least contributing to symptoms. There is no clear weakness to suggest myopathy. He has not been on a statin, so this is not the cause of  symptoms either. Fortunately, he is improving some since restarting insulin  and starting gabapentin and cymbalta for symptom control. I will send labs to look for treatable causes and monitor his symptoms for now.  PLAN: -Blood work: HbA1c, CK, lipid panel, TSH -Discussed EMG. Will hold off given reassuring neurologic examination and improving symptoms -Continue gabapentin 300 mg BID -Continue Cymbalta 60 mg daily -Patient to call with new or worsening symptoms  -Return to clinic in 6 months  The impression above as well as the plan as outlined below were extensively discussed with the patient who voiced understanding. All questions were answered to their satisfaction.  When available, results of the above investigations and possible further recommendations will be communicated to the patient via telephone/MyChart. Patient to call office if not contacted after expected testing turnaround time.   Total time spent reviewing records, interview, history/exam, documentation, and  coordination of care on day of encounter:  55 min   Thank you for allowing me to participate in patient's care.  If I can answer any additional questions, I would be pleased to do so.  Rommie Coats, MD   CC: Dickey Fought, PA [address] [place] [address]  CC: Referring provider: Dickey Fought, PA [address] [place],  [place] [address]

## 2023-09-29 ENCOUNTER — Other Ambulatory Visit: Payer: Self-pay

## 2023-09-29 ENCOUNTER — Encounter: Payer: Self-pay | Admitting: Neurology

## 2023-09-29 ENCOUNTER — Ambulatory Visit (INDEPENDENT_AMBULATORY_CARE_PROVIDER_SITE_OTHER): Payer: Self-pay | Admitting: Neurology

## 2023-09-29 VITALS — BP 126/92 | HR 80 | Ht 68.0 in | Wt 162.0 lb

## 2023-09-29 DIAGNOSIS — E1010 Type 1 diabetes mellitus with ketoacidosis without coma: Secondary | ICD-10-CM

## 2023-09-29 DIAGNOSIS — M791 Myalgia, unspecified site: Secondary | ICD-10-CM

## 2023-09-29 DIAGNOSIS — E785 Hyperlipidemia, unspecified: Secondary | ICD-10-CM

## 2023-09-29 NOTE — Patient Instructions (Signed)
 I saw you today for body aches. I am not sure the cause of your symptoms. Your neurologic examination looks good today though.   I would like to do some labs to look into possible causes. I will be in touch when I have your results.  For your symptoms: -Continue gabapentin 300 mg twice daily -Continue Cymbalta 60 mg daily  We will monitor your symptoms for now as they seem to be getting a little better. I need you to call me if you have new or worsening symptoms though.  I will see you back in clinic in about 6 months to retest your neurologic examination to make sure it still looks good.  The physicians and staff at [facility] are committed to providing excellent care. You may receive a survey requesting feedback about your experience at our office. We strive to receive "very good" responses to the survey questions. If you feel that your experience would prevent you from giving the office a "very good " response, please contact our office to try to remedy the situation. We may be reached at [phone]. Thank you for taking the time out of your busy day to complete the survey.  Rommie Coats, MD [facility]

## 2023-09-30 ENCOUNTER — Ambulatory Visit: Payer: Self-pay | Admitting: Neurology

## 2023-09-30 LAB — HEMOGLOBIN A1C
Hgb A1c MFr Bld: 11.1 % — ABNORMAL HIGH (ref <5.7)
Mean Plasma Glucose: 272 mg/dL
eAG (mmol/L): 15.1 mmol/L

## 2023-09-30 LAB — LIPID PANEL
Cholesterol: 186 mg/dL (ref <200)
HDL: 64 mg/dL (ref >=40)
LDL Cholesterol (Calc): 104 mg/dL — ABNORMAL HIGH
Non-HDL Cholesterol (Calc): 122 mg/dL (ref <130)
Total CHOL/HDL Ratio: 2.9 (calc) (ref <5.0)
Triglycerides: 89 mg/dL (ref <150)

## 2023-09-30 LAB — CK: Total CK: 119 U/L (ref 26–366)

## 2023-09-30 LAB — TSH: TSH: 2.12 m[IU]/L (ref 0.40–4.50)

## 2024-02-04 ENCOUNTER — Telehealth (HOSPITAL_COMMUNITY): Payer: Self-pay

## 2024-02-04 NOTE — Telephone Encounter (Signed)
 Pt's chart was accessed due to pt being on a backlogged list of patients needing assistance with PCP placement.  Per chart review, pt has since been seen by PCP with [facility]. No further action taken at this time.

## 2024-03-24 NOTE — Progress Notes (Deleted)
 NEUROLOGY FOLLOW UP OFFICE NOTE  Justin Wilkinson [id number]  Subjective:  Justin Wilkinson is a 43 y.o. year old right-handed male with a medical history of DM1, HLD, current smoker who we last saw on 09/29/23 for diffuse body/muscle aches.  To briefly review: 09/29/23: Patient's symptoms started in 03/2023. He got off work and felt his body was achy. He thought this was related to working and pains related. He was taking nightly baths. It felt like muscle chills. He also gets pinching sensations throughout his body (face all the way down). Pain was initially 10/10 and constant. Only sitting in hot water would help symptoms. Cold, rainy weather seems to make symptoms worse.   He denies significant weakness. He denies autonomic symptoms or changes in bowel or bladder. He denies any episodes of myotonia (unable to relax muscles). He denies neck or back pain.   He has been to ED and urgent care multiple times without clear etiology identified.   He saw his PCP who started gabapentin 300 mg twice daily and Cymbalta 60 mg daily about 1 month ago. The pain has improved some with this and the medication has made him more mobile/able to do things. He now rates the pain 6-7/10. He was on baclofen  by urgent care, but this did not help.   Patient is prescribed lipitor for HLD but has not been taking it for years.    He has not had similar symptoms in the past. He mentions that the last year has been very stressful in terms of family and he has not been taking care of himself. He admits to not taking his lantus  well. He started back taking lantus  this year (maybe 06/2023).   The patient has not noticed any recent skin rashes nor does he report any fever or night sweats. He has lost 30 lbs over 2 years. He thinks this is due to changing eating habits and maybe not eating as well as prior.   He is a current cigar smoker, 1-2 per day.   EtOH use: 2 beers on the weekend  Restrictive diet? no Family history of  neurologic disease? no  Most recent Assessment and Plan (09/29/23): Justin Wilkinson is a 43 y.o. male who presents for evaluation of diffuse body/muscle aches. He has a relevant medical history of DM1, HLD, and current smoker. His neurological examination is essentially normal today. Available diagnostic data is significant for normal CK, negative lyme, CCP, RF, ANA, and normal ESR. The etiology of patient's symptoms is currently unclear. He was not taking any medications for a while, including insulin  or lipitor (which he is still not taking). His untreated DM could be at least contributing to symptoms. There is no clear weakness to suggest myopathy. He has not been on a statin, so this is not the cause of symptoms either. Fortunately, he is improving some since restarting insulin  and starting gabapentin and cymbalta for symptom control. I will send labs to look for treatable causes and monitor his symptoms for now.   PLAN: -Blood work: HbA1c, CK, lipid panel, TSH -Discussed EMG. Will hold off given reassuring neurologic examination and improving symptoms -Continue gabapentin 300 mg BID -Continue Cymbalta 60 mg daily -Patient to call with new or worsening symptoms  Since their last visit: Labs were significant for HbA1c of 11.1. CK was normal as was TSH and lipid panel.  ***  MEDICATIONS:  Outpatient Encounter Medications as of 04/06/2024  Medication Sig   Alcohol  Swabs  PADS 1  Container by Does not apply route 2 (two) times daily.   atorvastatin  (LIPITOR) 40 MG tablet Take 1 tablet (40 mg total) by mouth daily.   DULoxetine (CYMBALTA) 60 MG capsule Take 60 mg by mouth daily.   Fingerstix Lancets MISC 1 Container by Does not apply route 2 (two) times daily.   gabapentin (NEURONTIN) 300 MG capsule Take 300 mg by mouth 2 (two) times daily.   glucose blood (ACCU-CHEK GUIDE) test strip 1 each by Other route 4 (four) times daily. And lancets 4/day   LANTUS  SOLOSTAR 100 UNIT/ML Solostar Pen INJECT 55  UNITS INTO THE SKIN EVERY MORNING (Patient taking differently: 40 Units daily.)   No facility-administered encounter medications on file as of 04/06/2024.    PAST MEDICAL HISTORY: Past Medical History:  Diagnosis Date   Diabetes mellitus without complication (HCC)    Hyperlipidemia    Hypertension     PAST SURGICAL HISTORY: No past surgical history on file.  ALLERGIES: Allergies  Allergen Reactions   Fish Allergy Anaphylaxis   Shellfish Allergy Anaphylaxis    FAMILY HISTORY: Family History  Problem Relation Age of Onset   Lung cancer Father    Diabetes Maternal Grandmother     SOCIAL HISTORY: Social History   Tobacco Use   Smoking status: Former    Current packs/day: 0.00    Types: Cigarettes, Cigars    Quit date: 12/15/2012    Years since quitting: 11.2   Smokeless tobacco: Never   Tobacco comments:    1 or 2 a day cigars  Vaping Use   Vaping status: Never Used  Substance Use Topics   Alcohol  use: No   Drug use: No   Social History   Social History Narrative   Are you right handed or left handed? Right   Are you currently employed ?    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 yes   Who lives with you?    What type of home do you live in: 1 story or 2 story? one    Caffiene occ      Objective:  Vital Signs:  There were no vitals taken for this visit.  ***  Labs and Imaging review: New results: 09/29/23: TSH wnl CK: 119 HbA1c: 11.1 Lipid panel: tChol 186, LDL 104, TG 89  Previously reviewed results: 07/07/23: Lyme negative CCP negative RF negative ANA negative ESR wnl   CK (06/10/23): 85   HbA1c (01/20/21): 6.6 HbA1c (02/26/20): 13.6   External labs: 07/19/23: CMP significant for glucose 335 CBC unremarkable  Assessment/Plan:  This is Justin Wilkinson, a 43 y.o. male with: ***   Plan: ***  Return to clinic in ***  Total time spent reviewing records, interview, history/exam, documentation,  and coordination of care on day of encounter:  *** min  Venetia Potters, MD

## 2024-04-06 ENCOUNTER — Encounter: Payer: Self-pay | Admitting: Neurology

## 2024-04-06 ENCOUNTER — Ambulatory Visit: Payer: Self-pay | Admitting: Neurology
